# Patient Record
Sex: Male | Born: 1960
Health system: Southern US, Community
[De-identification: ages and names within clinical notes are randomized; demographics above are authoritative.]

## PROBLEM LIST (undated history)

## (undated) DIAGNOSIS — T4145XA Adverse effect of unspecified anesthetic, initial encounter: Secondary | ICD-10-CM

## (undated) DIAGNOSIS — T8859XA Other complications of anesthesia, initial encounter: Secondary | ICD-10-CM

## (undated) DIAGNOSIS — R112 Nausea with vomiting, unspecified: Secondary | ICD-10-CM

## (undated) DIAGNOSIS — Z9889 Other specified postprocedural states: Secondary | ICD-10-CM

## (undated) DIAGNOSIS — M1990 Unspecified osteoarthritis, unspecified site: Secondary | ICD-10-CM

---
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
## 2016-09-06 DIAGNOSIS — Z0001 Encounter for general adult medical examination with abnormal findings: Secondary | ICD-10-CM | POA: Diagnosis not present

## 2016-09-09 ENCOUNTER — Encounter (INDEPENDENT_AMBULATORY_CARE_PROVIDER_SITE_OTHER): Payer: Self-pay | Admitting: *Deleted

## 2017-01-03 DIAGNOSIS — M25561 Pain in right knee: Secondary | ICD-10-CM | POA: Diagnosis not present

## 2017-01-06 DIAGNOSIS — M170 Bilateral primary osteoarthritis of knee: Secondary | ICD-10-CM | POA: Diagnosis not present

## 2017-01-19 DIAGNOSIS — M25561 Pain in right knee: Secondary | ICD-10-CM | POA: Diagnosis not present

## 2017-01-20 DIAGNOSIS — M25561 Pain in right knee: Secondary | ICD-10-CM | POA: Diagnosis not present

## 2017-01-23 DIAGNOSIS — Z79899 Other long term (current) drug therapy: Secondary | ICD-10-CM | POA: Diagnosis not present

## 2017-01-23 DIAGNOSIS — R42 Dizziness and giddiness: Secondary | ICD-10-CM | POA: Diagnosis not present

## 2017-01-23 DIAGNOSIS — R55 Syncope and collapse: Secondary | ICD-10-CM | POA: Diagnosis not present

## 2017-01-23 DIAGNOSIS — R918 Other nonspecific abnormal finding of lung field: Secondary | ICD-10-CM | POA: Diagnosis not present

## 2017-01-27 DIAGNOSIS — M170 Bilateral primary osteoarthritis of knee: Secondary | ICD-10-CM | POA: Diagnosis not present

## 2017-01-27 DIAGNOSIS — M25561 Pain in right knee: Secondary | ICD-10-CM | POA: Diagnosis not present

## 2017-02-24 DIAGNOSIS — M1711 Unilateral primary osteoarthritis, right knee: Secondary | ICD-10-CM | POA: Diagnosis not present

## 2017-03-22 DIAGNOSIS — M1711 Unilateral primary osteoarthritis, right knee: Secondary | ICD-10-CM | POA: Diagnosis not present

## 2017-04-06 DIAGNOSIS — M1711 Unilateral primary osteoarthritis, right knee: Secondary | ICD-10-CM | POA: Diagnosis not present

## 2017-05-05 DIAGNOSIS — M1711 Unilateral primary osteoarthritis, right knee: Secondary | ICD-10-CM | POA: Diagnosis not present

## 2017-05-24 DIAGNOSIS — R05 Cough: Secondary | ICD-10-CM | POA: Diagnosis not present

## 2017-05-24 DIAGNOSIS — J3089 Other allergic rhinitis: Secondary | ICD-10-CM | POA: Diagnosis not present

## 2017-05-24 DIAGNOSIS — M25561 Pain in right knee: Secondary | ICD-10-CM | POA: Diagnosis not present

## 2017-06-20 NOTE — Progress Notes (Signed)
Please place orders in EPIC as patient has a pre-op appointment on 06/27/2017! Thank you!

## 2017-06-21 ENCOUNTER — Ambulatory Visit: Payer: Self-pay | Admitting: Orthopedic Surgery

## 2017-06-24 NOTE — Patient Instructions (Signed)
Jordan Lopez  06/24/2017   Your procedure is scheduled on: 07/04/2017   Report to [facility] Main  Entrance  Go to Admitting at 0800am      Call this number if you have problems the morning of surgery [phone]    Remember: ONLY 1 PERSON MAY GO WITH YOU TO SHORT STAY TO GET  READY MORNING OF YOUR SURGERY.  Do not eat food or drink liquids :After Midnight.     Take these medicines the morning of surgery with A SIP OF WATER:                                 You may not have any metal on your body including hair pins and              piercings  Do not wear jewelry, , lotions, powders or perfumes, deodorant                         Men may shave face and neck.   Do not bring valuables to the hospital. [place].  Contacts, dentures or bridgework may not be worn into surgery.  Leave suitcase in the car. After surgery it may be brought to your room.                     Please read over the following fact sheets you were given: _____________________________________________________________________             [facility] - Preparing for Surgery Before surgery, you can play an important role.  Because skin is not sterile, your skin needs to be as free of germs as possible.  You can reduce the number of germs on your skin by washing with CHG (chlorahexidine gluconate) soap before surgery.  CHG is an antiseptic cleaner which kills germs and bonds with the skin to continue killing germs even after washing. Please DO NOT use if you have an allergy to CHG or antibacterial soaps.  If your skin becomes reddened/irritated stop using the CHG and inform your nurse when you arrive at Short Stay. Do not shave (including legs and underarms) for at least 48 hours prior to the first CHG shower.  You may shave your face/neck. Please follow these instructions carefully:  1.  Shower with CHG Soap the night before surgery and  the  morning of Surgery.  2.  If you choose to wash your hair, wash your hair first as usual with your  normal  shampoo.  3.  After you shampoo, rinse your hair and body thoroughly to remove the  shampoo.                           4.  Use CHG as you would any other liquid soap.  You can apply chg directly  to the skin and wash                       Gently with a scrungie or clean washcloth.  5.  Apply the CHG Soap to your body ONLY FROM THE NECK DOWN.   Do not use on face/ open  Wound or open sores. Avoid contact with eyes, ears mouth and genitals (private parts).                       Wash face,  Genitals (private parts) with your normal soap.             6.  Wash thoroughly, paying special attention to the area where your surgery  will be performed.  7.  Thoroughly rinse your body with warm water from the neck down.  8.  DO NOT shower/wash with your normal soap after using and rinsing off  the CHG Soap.                9.  Pat yourself dry with a clean towel.            10.  Wear clean pajamas.            11.  Place clean sheets on your bed the night of your first shower and do not  sleep with pets. Day of Surgery : Do not apply any lotions/deodorants the morning of surgery.  Please wear clean clothes to the hospital/surgery center.  FAILURE TO FOLLOW THESE INSTRUCTIONS MAY RESULT IN THE CANCELLATION OF YOUR SURGERY PATIENT SIGNATURE_________________________________  NURSE SIGNATURE__________________________________  ________________________________________________________________________  WHAT IS A BLOOD TRANSFUSION? Blood Transfusion Information  A transfusion is the replacement of blood or some of its parts. Blood is made up of multiple cells which provide different functions.  Red blood cells carry oxygen and are used for blood loss replacement.  White blood cells fight against infection.  Platelets control bleeding.  Plasma helps clot blood.  Other  blood products are available for specialized needs, such as hemophilia or other clotting disorders. BEFORE THE TRANSFUSION  Who gives blood for transfusions?   Healthy volunteers who are fully evaluated to make sure their blood is safe. This is blood bank blood. Transfusion therapy is the safest it has ever been in the practice of medicine. Before blood is taken from a donor, a complete history is taken to make sure that person has no history of diseases nor engages in risky social behavior (examples are intravenous drug use or sexual activity with multiple partners). The donor's travel history is screened to minimize risk of transmitting infections, such as malaria. The donated blood is tested for signs of infectious diseases, such as HIV and hepatitis. The blood is then tested to be sure it is compatible with you in order to minimize the chance of a transfusion reaction. If you or a relative donates blood, this is often done in anticipation of surgery and is not appropriate for emergency situations. It takes many days to process the donated blood. RISKS AND COMPLICATIONS Although transfusion therapy is very safe and saves many lives, the main dangers of transfusion include:   Getting an infectious disease.  Developing a transfusion reaction. This is an allergic reaction to something in the blood you were given. Every precaution is taken to prevent this. The decision to have a blood transfusion has been considered carefully by your caregiver before blood is given. Blood is not given unless the benefits outweigh the risks. AFTER THE TRANSFUSION  Right after receiving a blood transfusion, you will usually feel much better and more energetic. This is especially true if your red blood cells have gotten low (anemic). The transfusion raises the level of the red blood cells which carry oxygen, and this usually causes an energy increase.  The  nurse administering the transfusion will monitor you carefully  for complications. HOME CARE INSTRUCTIONS  No special instructions are needed after a transfusion. You may find your energy is better. Speak with your caregiver about any limitations on activity for underlying diseases you may have. SEEK MEDICAL CARE IF:   Your condition is not improving after your transfusion.  You develop redness or irritation at the intravenous (IV) site. SEEK IMMEDIATE MEDICAL CARE IF:  Any of the following symptoms occur over the next 12 hours:  Shaking chills.  You have a temperature by mouth above 102 F (38.9 C), not controlled by medicine.  Chest, back, or muscle pain.  People around you feel you are not acting correctly or are confused.  Shortness of breath or difficulty breathing.  Dizziness and fainting.  You get a rash or develop hives.  You have a decrease in urine output.  Your urine turns a dark color or changes to pink, red, or brown. Any of the following symptoms occur over the next 10 days:  You have a temperature by mouth above 102 F (38.9 C), not controlled by medicine.  Shortness of breath.  Weakness after normal activity.  The white part of the eye turns yellow (jaundice).  You have a decrease in the amount of urine or are urinating less often.  Your urine turns a dark color or changes to pink, red, or brown. Document Released: 08/13/2000 Document Revised: 11/08/2011 Document Reviewed: 04/01/2008 ExitCare Patient Information 2014 [place].  _______________________________________________________________________  Incentive Spirometer  An incentive spirometer is a tool that can help keep your lungs clear and active. This tool measures how well you are filling your lungs with each breath. Taking long deep breaths may help reverse or decrease the chance of developing breathing (pulmonary) problems (especially infection) following:  A long period of time when you are unable to move or be active. BEFORE THE PROCEDURE    If the spirometer includes an indicator to show your best effort, your nurse or respiratory therapist will set it to a desired goal.  If possible, sit up straight or lean slightly forward. Try not to slouch.  Hold the incentive spirometer in an upright position. INSTRUCTIONS FOR USE  1. Sit on the edge of your bed if possible, or sit up as far as you can in bed or on a chair. 2. Hold the incentive spirometer in an upright position. 3. Breathe out normally. 4. Place the mouthpiece in your mouth and seal your lips tightly around it. 5. Breathe in slowly and as deeply as possible, raising the piston or the ball toward the top of the column. 6. Hold your breath for 3-5 seconds or for as long as possible. Allow the piston or ball to fall to the bottom of the column. 7. Remove the mouthpiece from your mouth and breathe out normally. 8. Rest for a few seconds and repeat Steps 1 through 7 at least 10 times every 1-2 hours when you are awake. Take your time and take a few normal breaths between deep breaths. 9. The spirometer may include an indicator to show your best effort. Use the indicator as a goal to work toward during each repetition. 10. After each set of 10 deep breaths, practice coughing to be sure your lungs are clear. If you have an incision (the cut made at the time of surgery), support your incision when coughing by placing a pillow or rolled up towels firmly against it. Once you are able to get  out of bed, walk around indoors and cough well. You may stop using the incentive spirometer when instructed by your caregiver.  RISKS AND COMPLICATIONS  Take your time so you do not get dizzy or light-headed.  If you are in pain, you may need to take or ask for pain medication before doing incentive spirometry. It is harder to take a deep breath if you are having pain. AFTER USE  Rest and breathe slowly and easily.  It can be helpful to keep track of a log of your progress. Your caregiver  can provide you with a simple table to help with this. If you are using the spirometer at home, follow these instructions: [place] IF:   You are having difficultly using the spirometer.  You have trouble using the spirometer as often as instructed.  Your pain medication is not giving enough relief while using the spirometer.  You develop fever of 100.5 F (38.1 C) or higher. SEEK IMMEDIATE MEDICAL CARE IF:   You cough up bloody sputum that had not been present before.  You develop fever of 102 F (38.9 C) or greater.  You develop worsening pain at or near the incision site. MAKE SURE YOU:   Understand these instructions.  Will watch your condition.  Will get help right away if you are not doing well or get worse. Document Released: 12/27/2006 Document Revised: 11/08/2011 Document Reviewed: 02/27/2007 [facility] Patient Information 2014 [place], [place].   ________________________________________________________________________

## 2017-06-26 ENCOUNTER — Ambulatory Visit: Payer: Self-pay | Admitting: Orthopedic Surgery

## 2017-06-27 ENCOUNTER — Encounter (HOSPITAL_COMMUNITY)
Admission: RE | Admit: 2017-06-27 | Discharge: 2017-06-27 | Disposition: A | Discharge status: Home / Self Care | Payer: 59 | Source: Ambulatory Visit | Attending: Orthopedic Surgery | Admitting: Orthopedic Surgery

## 2017-06-27 ENCOUNTER — Encounter (INDEPENDENT_AMBULATORY_CARE_PROVIDER_SITE_OTHER): Payer: Self-pay

## 2017-06-27 ENCOUNTER — Encounter (HOSPITAL_COMMUNITY): Payer: Self-pay

## 2017-06-27 DIAGNOSIS — Z0183 Encounter for blood typing: Secondary | ICD-10-CM | POA: Diagnosis not present

## 2017-06-27 DIAGNOSIS — M1711 Unilateral primary osteoarthritis, right knee: Secondary | ICD-10-CM | POA: Insufficient documentation

## 2017-06-27 DIAGNOSIS — Z01812 Encounter for preprocedural laboratory examination: Secondary | ICD-10-CM | POA: Insufficient documentation

## 2017-06-27 HISTORY — DX: Unspecified osteoarthritis, unspecified site: M19.90

## 2017-06-27 HISTORY — DX: Other specified postprocedural states: Z98.890

## 2017-06-27 HISTORY — DX: Adverse effect of unspecified anesthetic, initial encounter: T41.45XA

## 2017-06-27 HISTORY — DX: Nausea with vomiting, unspecified: R11.2

## 2017-06-27 HISTORY — DX: Other complications of anesthesia, initial encounter: T88.59XA

## 2017-06-27 LAB — COMPREHENSIVE METABOLIC PANEL
ALT: 19 U/L (ref 17–63)
AST: 21 U/L (ref 15–41)
Albumin: 3.9 g/dL (ref 3.5–5.0)
Alkaline Phosphatase: 71 U/L (ref 38–126)
Anion gap: 11 (ref 5–15)
BUN: 15 mg/dL (ref 6–20)
CO2: 26 mmol/L (ref 22–32)
Calcium: 9.4 mg/dL (ref 8.9–10.3)
Chloride: 102 mmol/L (ref 101–111)
Creatinine, Ser: 1.22 mg/dL (ref 0.61–1.24)
GFR calc Af Amer: >60 mL/min (ref >60)
GFR calc non Af Amer: >60 mL/min (ref >60)
Glucose, Bld: 114 mg/dL — ABNORMAL HIGH (ref 65–99)
Potassium: 4.5 mmol/L (ref 3.5–5.1)
Sodium: 139 mmol/L (ref 135–145)
Total Bilirubin: 0.6 mg/dL (ref 0.3–1.2)
Total Protein: 7.4 g/dL (ref 6.5–8.1)

## 2017-06-27 LAB — CBC
HCT: 46 % (ref 39.0–52.0)
Hemoglobin: 16.1 g/dL (ref 13.0–17.0)
MCH: 30.3 pg (ref 26.0–34.0)
MCHC: 35 g/dL (ref 30.0–36.0)
MCV: 86.6 fL (ref 78.0–100.0)
Platelets: 152 10*3/uL (ref 150–400)
RBC: 5.31 MIL/uL (ref 4.22–5.81)
RDW: 13.1 % (ref 11.5–15.5)
WBC: 10.8 10*3/uL — ABNORMAL HIGH (ref 4.0–10.5)

## 2017-06-27 LAB — PROTIME-INR
INR: 1.03
Prothrombin Time: 13.4 seconds (ref 11.4–15.2)

## 2017-06-27 LAB — APTT: aPTT: 29 seconds (ref 24–36)

## 2017-06-27 LAB — ABO/RH: ABO/RH(D): O POS

## 2017-06-27 NOTE — Progress Notes (Signed)
05/24/17-Clearance- Dr Quintin Alto on chart  CXR-08/04/16 on chart

## 2017-06-28 ENCOUNTER — Ambulatory Visit: Payer: Self-pay | Admitting: Orthopedic Surgery

## 2017-06-28 LAB — SURGICAL PCR SCREEN
MRSA, PCR: POSITIVE — AB
Staphylococcus aureus: POSITIVE — AB

## 2017-06-28 MED ORDER — VANCOMYCIN HCL 10 G IV SOLR: 1000.0000 mg | Freq: Once | INTRAVENOUS | Status: AC

## 2017-06-28 NOTE — Progress Notes (Signed)
Vancomycin IV was ordered to be given at time of surgery on July 04, 2017 due to positive Staph swab. Arlee Muslim, PA-C

## 2017-07-01 NOTE — Progress Notes (Signed)
MRSA+  PCR, Surgery 07/04/17 OK to place order for Vancomycin per Dr Maureen Ralphs.

## 2017-07-03 ENCOUNTER — Ambulatory Visit: Payer: Self-pay | Admitting: Orthopedic Surgery

## 2017-07-03 NOTE — H&P (Signed)
Jordan Lopez DOB: [birth year] Married / Language: Cleophus Molt / Race: White Male Date of admission: July 04, 2017   Chief complaint: Right knee pain History of Present Illness  The patient is a 56 year old male who comes in for a preoperative History and Physical. The patient is scheduled for a right total knee arthroplasty to be performed by Dr. Dione Plover. Aluisio, MD at [facility] on 07/04/2017. The patient is a 56 year old male who presented for follow up of their knee. The patient is being followed for their right knee pain and osteoarthritis. They are now months out from [place] injection. Symptoms reported include: pain, swelling, aching, stiffness and difficulty ambulating. The patient feels that they are doing poorly. The following medication has been used for pain control: antiinflammatory medication (Aleve, prn). The patient has not gotten any relief of their symptoms with viscosupplementation. He is not in as much pain as he was a few months ago but that is because he has not been working since May. He is at a point where he would like to discuss knee replacement. He does work as an Clinical biochemist. He is having problems with that knee at all times. He has been unable to go back to work due to the knee pain and dysfunction. Cortisone and Visco supplements have not been of benefit. The knee is essentially taken over his life. he has developed advanced end-stage arthritis of the RIGHT knee with progressive pain and dysfunction. Patient has significant pain and dysfunction in their knee. They have had injections and failed nonoperative management. At this point the pain and dysfunction have essentially taken over their life. The most predictable means of improving pain and function is total knee arthroplasty. They have been treated conservatively in the past for the above stated problem and despite conservative measures, they continue to have progressive pain and severe functional  limitations and dysfunction. They have failed non-operative management including home exercise, medications, and injections. It is felt that they would benefit from undergoing total joint replacement. Risks and benefits of the procedure have been discussed with the patient and they elect to proceed with surgery. There are no active contraindications to surgery such as ongoing infection or rapidly progressive neurological disease.   Problem List/Past Medical Internal derangement of knee (M23.90)  left Acute pain of left knee (M25.562)  Primary osteoarthritis of one knee (M17.10)  left Measles  Mumps  Enlarged prostate  Allergies  Hydrocodone w/APAP *ANALGESICS - OPIOID*  Nausea.  Family History Heart Disease  First Degree Relatives. father Diabetes Mellitus  First Degree Relatives. father Cerebrovascular Accident  grandmother fathers side Congestive Heart Failure  father and grandfather fathers side Heart disease in male family member before age 28  Hypertension  father Osteoarthritis  mother Osteoporosis  mother  Social History Tobacco use  Never smoker. never smoker Alcohol use  current drinker; drinks beer; 5-7 per week Pain Contract  no Children  1 Current work status  working full time Drug/Alcohol Rehab (Currently)  no Drug/Alcohol Rehab (Previously)  no Exercise  Exercises rarely; does other Illicit drug use  no Living situation  live with spouse Marital status  married Number of flights of stairs before winded  greater than 5 Tobacco / smoke exposure  no Post-Surgical Plans  Home With Family, Home, Straight to Outpatient Therapy. Advance Directives  None.  Medication History Aleve (220MG  Tablet, Oral) Active. Proscar (5MG  Tablet, Oral) Active. Flomax (0.4MG  Capsule, Oral) Active.  Past Surgical History  Foot  Surgery  right    Review of Systems  General Present- Weight Loss. Not Present- Chills, Fatigue, Fever, Memory Loss,  Night Sweats and Weight Gain. Skin Not Present- Eczema, Hives, Itching, Lesions and Rash. HEENT Not Present- Dentures, Double Vision, Headache, Hearing Loss, Tinnitus and Visual Loss. Respiratory Present- Chronic Cough and Snoring. Not Present- Allergies, Coughing up blood, Shortness of breath at rest and Shortness of breath with exertion. Cardiovascular Not Present- Chest Pain, Difficulty Breathing Lying Down, Murmur, Palpitations, Racing/skipping heartbeats and Swelling. Gastrointestinal Not Present- Abdominal Pain, Bloody Stool, Constipation, Diarrhea, Difficulty Swallowing, Heartburn, Jaundice, Loss of appetitie, Nausea and Vomiting. Male Genitourinary Present- Urinary frequency and Weak urinary stream. Not Present- Blood in Urine, Discharge, Flank Pain, Incontinence, Painful Urination, Urgency, Urinary Retention and Urinating at Night. Musculoskeletal Present- Joint Pain and Joint Swelling. Not Present- Back Pain, Morning Stiffness, Muscle Pain, Muscle Weakness and Spasms. Neurological Present- Blackout spells and Fainting (Childhood occurrence, Reoccurrence once this year). Not Present- Difficulty with balance, Dizziness, Paralysis, Tremor and Weakness. Psychiatric Not Present- Insomnia.  Vitals  Weight: 238 lb Height: 68in Weight was reported by patient. Height was reported by patient. Body Surface Area: 2.2 m Body Mass Index: 36.19 kg/m  Pulse: 60 (Regular)  BP: 112/62 (Sitting, Right Arm, Standard)    Physical Exam  General Mental Status -Alert, cooperative and good historian. General Appearance-pleasant, Not in acute distress. Orientation-Oriented X3. Build & Nutrition-Well nourished and Well developed.  Head and Neck Head-normocephalic, atraumatic . Neck Global Assessment - supple, no bruit auscultated on the right, no bruit auscultated on the left.  Eye Pupil - Bilateral-Regular and Round. Motion - Bilateral-EOMI.  Chest and Lung  Exam Auscultation Breath sounds - clear at anterior chest wall and clear at posterior chest wall. Adventitious sounds - No Adventitious sounds.  Cardiovascular Auscultation Rhythm - Regular rate and rhythm. Heart Sounds - S1 WNL and S2 WNL. Murmurs & Other Heart Sounds - Auscultation of the heart reveals - No Murmurs.  Abdomen Inspection Contour - Generalized mild distention. Palpation/Percussion Tenderness - Abdomen is non-tender to palpation. Rigidity (guarding) - Abdomen is soft. Auscultation Auscultation of the abdomen reveals - Bowel sounds normal.  Male Genitourinary Note: Not done, not pertinent to present illness   Musculoskeletal Note: His RIGHT knee shows no effusion. He has a varus deformity. His range of motion is 5-125. He is tender medial greater than lateral with no instability noted. Pulses sensation and motor are intact. He has an antalgic gait pattern on the RIGHT.  We again reviewed his radiographs showing that he has bone-on-bone arthritis in the medial and patellofemoral compartments of the RIGHT knee.   Assessment & Plan  Primary osteoarthritis of right knee (M17.11)   Note:Surgical Plans: Right Total Knee Replacement  Disposition: Home with help, Outpatient Therapy at [facility] in [place], [place]  PCP: Dr. Quintin Alto - Patient has been seen preoperatively and felt to be stable for surgery.  IV TXA  Anesthesia Issues: Nausea  Patient was instructed on what medications to stop prior to surgery.  Signed electronically by Joelene Millin, III PA-C

## 2017-07-03 NOTE — H&P (View-Only) (Signed)
Jordan Lopez DOB: [birth year] Married / Language: Cleophus Molt / Race: White Male Date of admission: July 04, 2017   Chief complaint: Right knee pain History of Present Illness  The patient is a 56 year old male who comes in for a preoperative History and Physical. The patient is scheduled for a right total knee arthroplasty to be performed by Dr. Dione Plover. Aluisio, MD at [facility] on 07/04/2017. The patient is a 56 year old male who presented for follow up of their knee. The patient is being followed for their right knee pain and osteoarthritis. They are now months out from [place] injection. Symptoms reported include: pain, swelling, aching, stiffness and difficulty ambulating. The patient feels that they are doing poorly. The following medication has been used for pain control: antiinflammatory medication (Aleve, prn). The patient has not gotten any relief of their symptoms with viscosupplementation. He is not in as much pain as he was a few months ago but that is because he has not been working since May. He is at a point where he would like to discuss knee replacement. He does work as an Clinical biochemist. He is having problems with that knee at all times. He has been unable to go back to work due to the knee pain and dysfunction. Cortisone and Visco supplements have not been of benefit. The knee is essentially taken over his life. he has developed advanced end-stage arthritis of the RIGHT knee with progressive pain and dysfunction. Patient has significant pain and dysfunction in their knee. They have had injections and failed nonoperative management. At this point the pain and dysfunction have essentially taken over their life. The most predictable means of improving pain and function is total knee arthroplasty. They have been treated conservatively in the past for the above stated problem and despite conservative measures, they continue to have progressive pain and severe functional  limitations and dysfunction. They have failed non-operative management including home exercise, medications, and injections. It is felt that they would benefit from undergoing total joint replacement. Risks and benefits of the procedure have been discussed with the patient and they elect to proceed with surgery. There are no active contraindications to surgery such as ongoing infection or rapidly progressive neurological disease.   Problem List/Past Medical Internal derangement of knee (M23.90)  left Acute pain of left knee (M25.562)  Primary osteoarthritis of one knee (M17.10)  left Measles  Mumps  Enlarged prostate  Allergies  Hydrocodone w/APAP *ANALGESICS - OPIOID*  Nausea.  Family History Heart Disease  First Degree Relatives. father Diabetes Mellitus  First Degree Relatives. father Cerebrovascular Accident  grandmother fathers side Congestive Heart Failure  father and grandfather fathers side Heart disease in male family member before age 27  Hypertension  father Osteoarthritis  mother Osteoporosis  mother  Social History Tobacco use  Never smoker. never smoker Alcohol use  current drinker; drinks beer; 5-7 per week Pain Contract  no Children  1 Current work status  working full time Drug/Alcohol Rehab (Currently)  no Drug/Alcohol Rehab (Previously)  no Exercise  Exercises rarely; does other Illicit drug use  no Living situation  live with spouse Marital status  married Number of flights of stairs before winded  greater than 5 Tobacco / smoke exposure  no Post-Surgical Plans  Home With Family, Home, Straight to Outpatient Therapy. Advance Directives  None.  Medication History Aleve (220MG  Tablet, Oral) Active. Proscar (5MG  Tablet, Oral) Active. Flomax (0.4MG  Capsule, Oral) Active.  Past Surgical History  Foot  Surgery  right    Review of Systems  General Present- Weight Loss. Not Present- Chills, Fatigue, Fever, Memory Loss,  Night Sweats and Weight Gain. Skin Not Present- Eczema, Hives, Itching, Lesions and Rash. HEENT Not Present- Dentures, Double Vision, Headache, Hearing Loss, Tinnitus and Visual Loss. Respiratory Present- Chronic Cough and Snoring. Not Present- Allergies, Coughing up blood, Shortness of breath at rest and Shortness of breath with exertion. Cardiovascular Not Present- Chest Pain, Difficulty Breathing Lying Down, Murmur, Palpitations, Racing/skipping heartbeats and Swelling. Gastrointestinal Not Present- Abdominal Pain, Bloody Stool, Constipation, Diarrhea, Difficulty Swallowing, Heartburn, Jaundice, Loss of appetitie, Nausea and Vomiting. Male Genitourinary Present- Urinary frequency and Weak urinary stream. Not Present- Blood in Urine, Discharge, Flank Pain, Incontinence, Painful Urination, Urgency, Urinary Retention and Urinating at Night. Musculoskeletal Present- Joint Pain and Joint Swelling. Not Present- Back Pain, Morning Stiffness, Muscle Pain, Muscle Weakness and Spasms. Neurological Present- Blackout spells and Fainting (Childhood occurrence, Reoccurrence once this year). Not Present- Difficulty with balance, Dizziness, Paralysis, Tremor and Weakness. Psychiatric Not Present- Insomnia.  Vitals  Weight: 238 lb Height: 68in Weight was reported by patient. Height was reported by patient. Body Surface Area: 2.2 m Body Mass Index: 36.19 kg/m  Pulse: 60 (Regular)  BP: 112/62 (Sitting, Right Arm, Standard)    Physical Exam  General Mental Status -Alert, cooperative and good historian. General Appearance-pleasant, Not in acute distress. Orientation-Oriented X3. Build & Nutrition-Well nourished and Well developed.  Head and Neck Head-normocephalic, atraumatic . Neck Global Assessment - supple, no bruit auscultated on the right, no bruit auscultated on the left.  Eye Pupil - Bilateral-Regular and Round. Motion - Bilateral-EOMI.  Chest and Lung  Exam Auscultation Breath sounds - clear at anterior chest wall and clear at posterior chest wall. Adventitious sounds - No Adventitious sounds.  Cardiovascular Auscultation Rhythm - Regular rate and rhythm. Heart Sounds - S1 WNL and S2 WNL. Murmurs & Other Heart Sounds - Auscultation of the heart reveals - No Murmurs.  Abdomen Inspection Contour - Generalized mild distention. Palpation/Percussion Tenderness - Abdomen is non-tender to palpation. Rigidity (guarding) - Abdomen is soft. Auscultation Auscultation of the abdomen reveals - Bowel sounds normal.  Male Genitourinary Note: Not done, not pertinent to present illness   Musculoskeletal Note: His RIGHT knee shows no effusion. He has a varus deformity. His range of motion is 5-125. He is tender medial greater than lateral with no instability noted. Pulses sensation and motor are intact. He has an antalgic gait pattern on the RIGHT.  We again reviewed his radiographs showing that he has bone-on-bone arthritis in the medial and patellofemoral compartments of the RIGHT knee.   Assessment & Plan  Primary osteoarthritis of right knee (M17.11)   Note:Surgical Plans: Right Total Knee Replacement  Disposition: Home with help, Outpatient Therapy at [facility] in [place], [place]  PCP: Dr. Quintin Alto - Patient has been seen preoperatively and felt to be stable for surgery.  IV TXA  Anesthesia Issues: Nausea  Patient was instructed on what medications to stop prior to surgery.  Signed electronically by Joelene Millin, III PA-C

## 2017-07-04 ENCOUNTER — Inpatient Hospital Stay (HOSPITAL_COMMUNITY)
Admission: RE | Admit: 2017-07-04 | Discharge: 2017-07-05 | DRG: 470 | Disposition: A | Discharge status: Home / Self Care | Payer: 59 | Source: Ambulatory Visit | Attending: Orthopedic Surgery | Admitting: Orthopedic Surgery

## 2017-07-04 ENCOUNTER — Encounter (HOSPITAL_COMMUNITY)
Admission: RE | Disposition: A | Discharge status: Home / Self Care | Payer: Self-pay | Source: Ambulatory Visit | Attending: Orthopedic Surgery

## 2017-07-04 ENCOUNTER — Other Ambulatory Visit: Payer: Self-pay

## 2017-07-04 ENCOUNTER — Inpatient Hospital Stay (HOSPITAL_COMMUNITY): Payer: 59 | Admitting: Registered Nurse

## 2017-07-04 ENCOUNTER — Encounter (HOSPITAL_COMMUNITY): Payer: Self-pay | Admitting: Registered Nurse

## 2017-07-04 DIAGNOSIS — E669 Obesity, unspecified: Secondary | ICD-10-CM | POA: Diagnosis present

## 2017-07-04 DIAGNOSIS — R269 Unspecified abnormalities of gait and mobility: Secondary | ICD-10-CM | POA: Diagnosis not present

## 2017-07-04 DIAGNOSIS — M1711 Unilateral primary osteoarthritis, right knee: Secondary | ICD-10-CM | POA: Diagnosis not present

## 2017-07-04 DIAGNOSIS — Z6836 Body mass index (BMI) 36.0-36.9, adult: Secondary | ICD-10-CM

## 2017-07-04 DIAGNOSIS — Z22322 Carrier or suspected carrier of Methicillin resistant Staphylococcus aureus: Secondary | ICD-10-CM | POA: Diagnosis not present

## 2017-07-04 DIAGNOSIS — M179 Osteoarthritis of knee, unspecified: Secondary | ICD-10-CM | POA: Diagnosis present

## 2017-07-04 DIAGNOSIS — N400 Enlarged prostate without lower urinary tract symptoms: Secondary | ICD-10-CM | POA: Diagnosis present

## 2017-07-04 DIAGNOSIS — M25561 Pain in right knee: Secondary | ICD-10-CM | POA: Diagnosis not present

## 2017-07-04 DIAGNOSIS — G8918 Other acute postprocedural pain: Secondary | ICD-10-CM | POA: Diagnosis not present

## 2017-07-04 DIAGNOSIS — M1710 Unilateral primary osteoarthritis, unspecified knee: Secondary | ICD-10-CM | POA: Diagnosis present

## 2017-07-04 HISTORY — PX: TOTAL KNEE ARTHROPLASTY: SHX125

## 2017-07-04 LAB — COMPREHENSIVE METABOLIC PANEL
ALT: 19 U/L (ref 17–63)
AST: 18 U/L (ref 15–41)
Albumin: 4 g/dL (ref 3.5–5.0)
Alkaline Phosphatase: 64 U/L (ref 38–126)
Anion gap: 7 (ref 5–15)
BUN: 17 mg/dL (ref 6–20)
CO2: 28 mmol/L (ref 22–32)
Calcium: 9 mg/dL (ref 8.9–10.3)
Chloride: 103 mmol/L (ref 101–111)
Creatinine, Ser: 1.01 mg/dL (ref 0.61–1.24)
GFR calc Af Amer: >60 mL/min (ref >60)
GFR calc non Af Amer: >60 mL/min (ref >60)
Glucose, Bld: 144 mg/dL — ABNORMAL HIGH (ref 65–99)
Potassium: 4.9 mmol/L (ref 3.5–5.1)
Sodium: 138 mmol/L (ref 135–145)
Total Bilirubin: 0.9 mg/dL (ref 0.3–1.2)
Total Protein: 7 g/dL (ref 6.5–8.1)

## 2017-07-04 LAB — TYPE AND SCREEN
ABO/RH(D): O POS
Antibody Screen: NEGATIVE

## 2017-07-04 LAB — PROTIME-INR
INR: 1.01
Prothrombin Time: 13.3 seconds (ref 11.4–15.2)

## 2017-07-04 LAB — CBC
HCT: 42.1 % (ref 39.0–52.0)
Hemoglobin: 14.2 g/dL (ref 13.0–17.0)
MCH: 29.7 pg (ref 26.0–34.0)
MCHC: 33.7 g/dL (ref 30.0–36.0)
MCV: 88.1 fL (ref 78.0–100.0)
Platelets: 146 10*3/uL — ABNORMAL LOW (ref 150–400)
RBC: 4.78 MIL/uL (ref 4.22–5.81)
RDW: 12.8 % (ref 11.5–15.5)
WBC: 8.9 10*3/uL (ref 4.0–10.5)

## 2017-07-04 LAB — APTT: aPTT: 30 seconds (ref 24–36)

## 2017-07-04 SURGERY — ARTHROPLASTY, KNEE, TOTAL
Anesthesia: Spinal | Site: Knee | Laterality: Right

## 2017-07-04 MED ORDER — 0.9 % SODIUM CHLORIDE (POUR BTL) OPTIME
TOPICAL | Status: DC | PRN
  Administered 2017-07-04: 1000 mL

## 2017-07-04 MED ORDER — CEFAZOLIN SODIUM-DEXTROSE 2-4 GM/100ML-% IV SOLN
2.0000 g | INTRAVENOUS | Status: AC
  Administered 2017-07-04: 2 g via INTRAVENOUS

## 2017-07-04 MED ORDER — ALBUMIN HUMAN 5 % IV SOLN
12.5000 g | Freq: Once | INTRAVENOUS | Status: AC
  Administered 2017-07-04: 12.5 g via INTRAVENOUS

## 2017-07-04 MED ORDER — GABAPENTIN 300 MG PO CAPS
300.0000 mg | ORAL_CAPSULE | Freq: Once | ORAL | Status: AC
  Administered 2017-07-04: 300 mg via ORAL

## 2017-07-04 MED ORDER — MIDAZOLAM HCL 2 MG/2ML IJ SOLN
INTRAMUSCULAR | Status: AC
  Filled 2017-07-04: qty 2

## 2017-07-04 MED ORDER — METOCLOPRAMIDE HCL 5 MG PO TABS: 5.0000 mg | ORAL_TABLET | Freq: Three times a day (TID) | ORAL | Status: DC | PRN

## 2017-07-04 MED ORDER — BUPIVACAINE IN DEXTROSE 0.75-8.25 % IT SOLN
INTRATHECAL | Status: DC | PRN
  Administered 2017-07-04: 2 mL via INTRATHECAL

## 2017-07-04 MED ORDER — BUPIVACAINE LIPOSOME 1.3 % IJ SUSP
20.0000 mL | Freq: Once | INTRAMUSCULAR | Status: DC
  Filled 2017-07-04: qty 20

## 2017-07-04 MED ORDER — POLYETHYLENE GLYCOL 3350 17 G PO PACK: 17.0000 g | PACK | Freq: Every day | ORAL | Status: DC | PRN

## 2017-07-04 MED ORDER — PHENOL 1.4 % MT LIQD: 1.0000 | OROMUCOSAL | Status: DC | PRN

## 2017-07-04 MED ORDER — CEFAZOLIN SODIUM-DEXTROSE 2-4 GM/100ML-% IV SOLN
INTRAVENOUS | Status: AC
  Filled 2017-07-04: qty 100

## 2017-07-04 MED ORDER — PROPOFOL 10 MG/ML IV BOLUS
INTRAVENOUS | Status: AC
  Filled 2017-07-04: qty 40

## 2017-07-04 MED ORDER — CEFAZOLIN SODIUM-DEXTROSE 2-4 GM/100ML-% IV SOLN
2.0000 g | Freq: Four times a day (QID) | INTRAVENOUS | Status: AC
  Administered 2017-07-04 (×2): 2 g via INTRAVENOUS
  Filled 2017-07-04 (×2): qty 100

## 2017-07-04 MED ORDER — ONDANSETRON HCL 4 MG PO TABS: 4.0000 mg | ORAL_TABLET | Freq: Four times a day (QID) | ORAL | Status: DC | PRN

## 2017-07-04 MED ORDER — OXYCODONE HCL 5 MG PO TABS: 10.0000 mg | ORAL_TABLET | ORAL | Status: DC | PRN

## 2017-07-04 MED ORDER — PROMETHAZINE HCL 25 MG/ML IJ SOLN: 6.2500 mg | INTRAMUSCULAR | Status: DC | PRN

## 2017-07-04 MED ORDER — SCOPOLAMINE 1 MG/3DAYS TD PT72
MEDICATED_PATCH | TRANSDERMAL | Status: AC
  Filled 2017-07-04: qty 1

## 2017-07-04 MED ORDER — ONDANSETRON HCL 4 MG/2ML IJ SOLN: 4.0000 mg | Freq: Four times a day (QID) | INTRAMUSCULAR | Status: DC | PRN

## 2017-07-04 MED ORDER — FINASTERIDE 5 MG PO TABS
5.0000 mg | ORAL_TABLET | Freq: Every day | ORAL | Status: DC
  Administered 2017-07-05: 5 mg via ORAL
  Filled 2017-07-04: qty 1

## 2017-07-04 MED ORDER — DEXAMETHASONE SODIUM PHOSPHATE 10 MG/ML IJ SOLN: 10.0000 mg | Freq: Once | INTRAMUSCULAR | Status: DC

## 2017-07-04 MED ORDER — FLEET ENEMA 7-19 GM/118ML RE ENEM: 1.0000 | ENEMA | Freq: Once | RECTAL | Status: DC | PRN

## 2017-07-04 MED ORDER — DOCUSATE SODIUM 100 MG PO CAPS
100.0000 mg | ORAL_CAPSULE | Freq: Two times a day (BID) | ORAL | Status: DC
  Administered 2017-07-04 – 2017-07-05 (×2): 100 mg via ORAL
  Filled 2017-07-04 (×2): qty 1

## 2017-07-04 MED ORDER — TRANEXAMIC ACID 1000 MG/10ML IV SOLN
1000.0000 mg | INTRAVENOUS | Status: AC
  Administered 2017-07-04: 1000 mg via INTRAVENOUS
  Filled 2017-07-04: qty 1100

## 2017-07-04 MED ORDER — SCOPOLAMINE 1 MG/3DAYS TD PT72
MEDICATED_PATCH | TRANSDERMAL | Status: DC | PRN
  Administered 2017-07-04: 1 via TRANSDERMAL

## 2017-07-04 MED ORDER — GABAPENTIN 300 MG PO CAPS
ORAL_CAPSULE | ORAL | Status: AC
  Filled 2017-07-04: qty 1

## 2017-07-04 MED ORDER — MIDAZOLAM HCL 5 MG/5ML IJ SOLN
INTRAMUSCULAR | Status: DC | PRN
Start: 2017-07-04 — End: 2017-07-04
  Administered 2017-07-04 (×2): 1 mg via INTRAVENOUS

## 2017-07-04 MED ORDER — DM-GUAIFENESIN ER 30-600 MG PO TB12
1.0000 | ORAL_TABLET | Freq: Every day | ORAL | Status: DC
  Administered 2017-07-05: 1 via ORAL
  Filled 2017-07-04: qty 1

## 2017-07-04 MED ORDER — BISACODYL 10 MG RE SUPP: 10.0000 mg | Freq: Every day | RECTAL | Status: DC | PRN

## 2017-07-04 MED ORDER — CHLORHEXIDINE GLUCONATE CLOTH 2 % EX PADS: 6.0000 | MEDICATED_PAD | Freq: Every day | CUTANEOUS | Status: DC

## 2017-07-04 MED ORDER — FENTANYL CITRATE (PF) 100 MCG/2ML IJ SOLN
INTRAMUSCULAR | Status: AC
  Filled 2017-07-04: qty 2

## 2017-07-04 MED ORDER — TRAMADOL HCL 50 MG PO TABS
50.0000 mg | ORAL_TABLET | Freq: Four times a day (QID) | ORAL | Status: DC | PRN
  Administered 2017-07-05: 16:00:00 100 mg via ORAL
  Filled 2017-07-04: qty 2

## 2017-07-04 MED ORDER — BUPIVACAINE LIPOSOME 1.3 % IJ SUSP
INTRAMUSCULAR | Status: DC | PRN
  Administered 2017-07-04: 20 mL

## 2017-07-04 MED ORDER — LORATADINE 10 MG PO TABS
10.0000 mg | ORAL_TABLET | Freq: Every day | ORAL | Status: DC
  Administered 2017-07-05: 10 mg via ORAL
  Filled 2017-07-04: qty 1

## 2017-07-04 MED ORDER — VANCOMYCIN HCL 10 G IV SOLR
1500.0000 mg | Freq: Once | INTRAVENOUS | Status: AC
  Administered 2017-07-04: 1500 mg via INTRAVENOUS
  Filled 2017-07-04: qty 1500

## 2017-07-04 MED ORDER — PHENYLEPHRINE 40 MCG/ML (10ML) SYRINGE FOR IV PUSH (FOR BLOOD PRESSURE SUPPORT)
PREFILLED_SYRINGE | INTRAVENOUS | Status: AC
  Filled 2017-07-04: qty 30

## 2017-07-04 MED ORDER — LIDOCAINE 2% (20 MG/ML) 5 ML SYRINGE
INTRAMUSCULAR | Status: AC
  Filled 2017-07-04: qty 5

## 2017-07-04 MED ORDER — SODIUM CHLORIDE 0.9 % IJ SOLN
INTRAMUSCULAR | Status: AC
  Filled 2017-07-04: qty 50

## 2017-07-04 MED ORDER — DEXAMETHASONE SODIUM PHOSPHATE 10 MG/ML IJ SOLN
10.0000 mg | Freq: Once | INTRAMUSCULAR | Status: AC
  Administered 2017-07-04: 10 mg via INTRAVENOUS

## 2017-07-04 MED ORDER — MUPIROCIN 2 % EX OINT
1.0000 "application " | TOPICAL_OINTMENT | Freq: Two times a day (BID) | CUTANEOUS | Status: DC
  Administered 2017-07-04 – 2017-07-05 (×3): 1 via NASAL
  Filled 2017-07-04: qty 22

## 2017-07-04 MED ORDER — PROPOFOL 500 MG/50ML IV EMUL
INTRAVENOUS | Status: DC | PRN
  Administered 2017-07-04: 50 ug/kg/min via INTRAVENOUS

## 2017-07-04 MED ORDER — PHENYLEPHRINE 40 MCG/ML (10ML) SYRINGE FOR IV PUSH (FOR BLOOD PRESSURE SUPPORT)
PREFILLED_SYRINGE | INTRAVENOUS | Status: DC | PRN
  Administered 2017-07-04: 80 ug via INTRAVENOUS
  Administered 2017-07-04: 40 ug via INTRAVENOUS
  Administered 2017-07-04: 80 ug via INTRAVENOUS
  Administered 2017-07-04: 120 ug via INTRAVENOUS

## 2017-07-04 MED ORDER — TAMSULOSIN HCL 0.4 MG PO CAPS
0.4000 mg | ORAL_CAPSULE | Freq: Every day | ORAL | Status: DC
  Administered 2017-07-05: 0.4 mg via ORAL
  Filled 2017-07-04: qty 1

## 2017-07-04 MED ORDER — OXYCODONE HCL 5 MG PO TABS
5.0000 mg | ORAL_TABLET | ORAL | Status: DC | PRN
  Administered 2017-07-05 (×3): 5 mg via ORAL
  Filled 2017-07-04 (×4): qty 1

## 2017-07-04 MED ORDER — PROPOFOL 10 MG/ML IV BOLUS
INTRAVENOUS | Status: AC
  Filled 2017-07-04: qty 20

## 2017-07-04 MED ORDER — MENTHOL 3 MG MT LOZG
1.0000 | LOZENGE | OROMUCOSAL | Status: DC | PRN
Start: 2017-07-04 — End: 2017-07-05

## 2017-07-04 MED ORDER — MIDAZOLAM HCL 2 MG/2ML IJ SOLN
1.0000 mg | INTRAMUSCULAR | Status: DC | PRN
Start: 2017-07-04 — End: 2017-07-04
  Administered 2017-07-04: 2 mg via INTRAVENOUS

## 2017-07-04 MED ORDER — METHOCARBAMOL 500 MG PO TABS
500.0000 mg | ORAL_TABLET | Freq: Four times a day (QID) | ORAL | Status: DC | PRN
  Administered 2017-07-05 (×2): 500 mg via ORAL
  Filled 2017-07-04 (×2): qty 1

## 2017-07-04 MED ORDER — TRANEXAMIC ACID 1000 MG/10ML IV SOLN
1000.0000 mg | Freq: Once | INTRAVENOUS | Status: AC
  Administered 2017-07-04: 15:00:00 1000 mg via INTRAVENOUS
  Filled 2017-07-04: qty 1100

## 2017-07-04 MED ORDER — ACETAMINOPHEN 325 MG PO TABS: 650.0000 mg | ORAL_TABLET | ORAL | Status: DC | PRN

## 2017-07-04 MED ORDER — SODIUM CHLORIDE 0.9 % IR SOLN
Status: DC | PRN
  Administered 2017-07-04: 1000 mL

## 2017-07-04 MED ORDER — LACTATED RINGERS IV SOLN
INTRAVENOUS | Status: DC
  Administered 2017-07-04: 1000 mL via INTRAVENOUS
  Administered 2017-07-04: 12:00:00 via INTRAVENOUS

## 2017-07-04 MED ORDER — DIPHENHYDRAMINE HCL 12.5 MG/5ML PO ELIX: 12.5000 mg | ORAL_SOLUTION | ORAL | Status: DC | PRN

## 2017-07-04 MED ORDER — ONDANSETRON HCL 4 MG/2ML IJ SOLN
INTRAMUSCULAR | Status: AC
  Filled 2017-07-04: qty 2

## 2017-07-04 MED ORDER — ONDANSETRON HCL 4 MG/2ML IJ SOLN
INTRAMUSCULAR | Status: DC | PRN
  Administered 2017-07-04: 4 mg via INTRAVENOUS

## 2017-07-04 MED ORDER — ACETAMINOPHEN 10 MG/ML IV SOLN
1000.0000 mg | Freq: Once | INTRAVENOUS | Status: AC
  Administered 2017-07-04: 1000 mg via INTRAVENOUS

## 2017-07-04 MED ORDER — DEXAMETHASONE SODIUM PHOSPHATE 10 MG/ML IJ SOLN
INTRAMUSCULAR | Status: AC
  Filled 2017-07-04: qty 1

## 2017-07-04 MED ORDER — ACETAMINOPHEN 500 MG PO TABS
1000.0000 mg | ORAL_TABLET | Freq: Four times a day (QID) | ORAL | Status: AC
  Administered 2017-07-04 – 2017-07-05 (×4): 1000 mg via ORAL
  Filled 2017-07-04 (×4): qty 2

## 2017-07-04 MED ORDER — ACETAMINOPHEN 650 MG RE SUPP: 650.0000 mg | RECTAL | Status: DC | PRN

## 2017-07-04 MED ORDER — METOCLOPRAMIDE HCL 5 MG/ML IJ SOLN: 5.0000 mg | Freq: Three times a day (TID) | INTRAMUSCULAR | Status: DC | PRN

## 2017-07-04 MED ORDER — SODIUM CHLORIDE 0.9 % IJ SOLN
INTRAMUSCULAR | Status: DC | PRN
Start: 2017-07-04 — End: 2017-07-04
  Administered 2017-07-04: 60 mL

## 2017-07-04 MED ORDER — ACETAMINOPHEN 10 MG/ML IV SOLN
INTRAVENOUS | Status: AC
  Filled 2017-07-04: qty 100

## 2017-07-04 MED ORDER — CHLORHEXIDINE GLUCONATE 4 % EX LIQD: 60.0000 mL | Freq: Once | CUTANEOUS | Status: DC

## 2017-07-04 MED ORDER — BUPIVACAINE HCL (PF) 0.25 % IJ SOLN
INTRAMUSCULAR | Status: AC
  Filled 2017-07-04: qty 30

## 2017-07-04 MED ORDER — SODIUM CHLORIDE 0.9 % IJ SOLN
INTRAMUSCULAR | Status: AC
  Filled 2017-07-04: qty 10

## 2017-07-04 MED ORDER — ALBUMIN HUMAN 5 % IV SOLN
INTRAVENOUS | Status: AC
  Filled 2017-07-04: qty 250

## 2017-07-04 MED ORDER — SODIUM CHLORIDE 0.9 % IV SOLN
INTRAVENOUS | Status: DC
Start: 2017-07-04 — End: 2017-07-05
  Administered 2017-07-04 – 2017-07-05 (×2): via INTRAVENOUS

## 2017-07-04 MED ORDER — FENTANYL CITRATE (PF) 100 MCG/2ML IJ SOLN: 25.0000 ug | INTRAMUSCULAR | Status: DC | PRN

## 2017-07-04 MED ORDER — FENTANYL CITRATE (PF) 100 MCG/2ML IJ SOLN
INTRAMUSCULAR | Status: DC | PRN
  Administered 2017-07-04: 50 ug via INTRAVENOUS

## 2017-07-04 MED ORDER — FENTANYL CITRATE (PF) 100 MCG/2ML IJ SOLN
50.0000 ug | INTRAMUSCULAR | Status: DC | PRN
  Administered 2017-07-04: 50 ug via INTRAVENOUS

## 2017-07-04 MED ORDER — METHOCARBAMOL 1000 MG/10ML IJ SOLN
500.0000 mg | Freq: Four times a day (QID) | INTRAVENOUS | Status: DC | PRN
  Filled 2017-07-04: qty 5

## 2017-07-04 MED ORDER — ROPIVACAINE HCL 7.5 MG/ML IJ SOLN
INTRAMUSCULAR | Status: DC | PRN
  Administered 2017-07-04: 20 mL via PERINEURAL

## 2017-07-04 MED ORDER — MORPHINE SULFATE (PF) 4 MG/ML IV SOLN: 1.0000 mg | INTRAVENOUS | Status: DC | PRN

## 2017-07-04 MED ORDER — RIVAROXABAN 10 MG PO TABS
10.0000 mg | ORAL_TABLET | Freq: Every day | ORAL | Status: DC
  Administered 2017-07-05: 10 mg via ORAL
  Filled 2017-07-04: qty 1

## 2017-07-04 SURGICAL SUPPLY — 50 items
BAG DECANTER FOR FLEXI CONT (MISCELLANEOUS) IMPLANT
BAG ZIPLOCK 12X15 (MISCELLANEOUS) ×3 IMPLANT
BANDAGE ACE 6X5 VEL STRL LF (GAUZE/BANDAGES/DRESSINGS) ×3 IMPLANT
BLADE SAG 18X100X1.27 (BLADE) ×3 IMPLANT
BLADE SAW SGTL 11.0X1.19X90.0M (BLADE) ×3 IMPLANT
BOWL SMART MIX CTS (DISPOSABLE) ×3 IMPLANT
CAPT KNEE TOTAL 3 ATTUNE ×3 IMPLANT
CEMENT HV SMART SET (Cement) ×6 IMPLANT
CLOSURE WOUND 1/2 X4 (GAUZE/BANDAGES/DRESSINGS) ×2
COVER SURGICAL LIGHT HANDLE (MISCELLANEOUS) ×3 IMPLANT
CUFF TOURN SGL QUICK 34 (TOURNIQUET CUFF) ×2
CUFF TRNQT CYL 34X4X40X1 (TOURNIQUET CUFF) ×1 IMPLANT
DECANTER SPIKE VIAL GLASS SM (MISCELLANEOUS) ×3 IMPLANT
DRAPE U-SHAPE 47X51 STRL (DRAPES) ×3 IMPLANT
DRSG ADAPTIC 3X8 NADH LF (GAUZE/BANDAGES/DRESSINGS) ×3 IMPLANT
DRSG PAD ABDOMINAL 8X10 ST (GAUZE/BANDAGES/DRESSINGS) IMPLANT
DURAPREP 26ML APPLICATOR (WOUND CARE) ×3 IMPLANT
ELECT REM PT RETURN 15FT ADLT (MISCELLANEOUS) ×3 IMPLANT
EVACUATOR 1/8 PVC DRAIN (DRAIN) ×3 IMPLANT
GAUZE SPONGE 4X4 12PLY STRL (GAUZE/BANDAGES/DRESSINGS) ×3 IMPLANT
GLOVE BIO SURGEON STRL SZ7.5 (GLOVE) IMPLANT
GLOVE BIO SURGEON STRL SZ8 (GLOVE) ×3 IMPLANT
GLOVE BIOGEL PI IND STRL 6.5 (GLOVE) IMPLANT
GLOVE BIOGEL PI IND STRL 8 (GLOVE) ×1 IMPLANT
GLOVE BIOGEL PI INDICATOR 6.5 (GLOVE)
GLOVE BIOGEL PI INDICATOR 8 (GLOVE) ×2
GLOVE SURG SS PI 6.5 STRL IVOR (GLOVE) IMPLANT
GOWN STRL REUS W/TWL LRG LVL3 (GOWN DISPOSABLE) ×3 IMPLANT
GOWN STRL REUS W/TWL XL LVL3 (GOWN DISPOSABLE) IMPLANT
HANDPIECE INTERPULSE COAX TIP (DISPOSABLE) ×2
IMMOBILIZER KNEE 20 (SOFTGOODS) ×3 IMPLANT
IMMOBILIZER KNEE 20 THIGH 36 (SOFTGOODS) IMPLANT
MANIFOLD NEPTUNE II (INSTRUMENTS) ×3 IMPLANT
NS IRRIG 1000ML POUR BTL (IV SOLUTION) ×3 IMPLANT
PACK TOTAL KNEE CUSTOM (KITS) ×3 IMPLANT
PAD ABD 8X10 STRL (GAUZE/BANDAGES/DRESSINGS) ×3 IMPLANT
PADDING CAST COTTON 6X4 STRL (CAST SUPPLIES) ×6 IMPLANT
POSITIONER SURGICAL ARM (MISCELLANEOUS) ×3 IMPLANT
SET HNDPC FAN SPRY TIP SCT (DISPOSABLE) ×1 IMPLANT
STRIP CLOSURE SKIN 1/2X4 (GAUZE/BANDAGES/DRESSINGS) ×4 IMPLANT
SUT MNCRL AB 4-0 PS2 18 (SUTURE) ×3 IMPLANT
SUT STRATAFIX 0 PDS 27 VIOLET (SUTURE) ×3
SUT VIC AB 2-0 CT1 27 (SUTURE) ×6
SUT VIC AB 2-0 CT1 TAPERPNT 27 (SUTURE) ×3 IMPLANT
SUTURE STRATFX 0 PDS 27 VIOLET (SUTURE) ×1 IMPLANT
SYR 30ML LL (SYRINGE) ×3 IMPLANT
TRAY FOLEY W/METER SILVER 16FR (SET/KITS/TRAYS/PACK) ×3 IMPLANT
WATER STERILE IRR 1000ML POUR (IV SOLUTION) ×6 IMPLANT
WRAP KNEE MAXI GEL POST OP (GAUZE/BANDAGES/DRESSINGS) ×3 IMPLANT
YANKAUER SUCT BULB TIP 10FT TU (MISCELLANEOUS) ×3 IMPLANT

## 2017-07-04 NOTE — Anesthesia Procedure Notes (Signed)
Anesthesia Regional Block: Adductor canal block   Pre-Anesthetic Checklist: ,, timeout performed, Correct Patient, Correct Site, Correct Laterality, Correct Procedure, Correct Position, site marked, Risks and benefits discussed,  Surgical consent,  Pre-op evaluation,  At surgeon's request and post-op pain management  Laterality: Right  Prep: chloraprep       Needles:  Injection technique: Single-shot  Needle Type: Echogenic Needle     Needle Length: 9cm  Needle Gauge: 21     Additional Needles:   Procedures:,,,, ultrasound used (permanent image in chart),,,,  Narrative:  Start time: 07/04/2017 10:03 AM End time: 07/04/2017 10:08 AM Injection made incrementally with aspirations every 5 mL.  Performed by: Personally  Anesthesiologist: Catalina Gravel, MD  Additional Notes: No pain on injection. No increased resistance to injection. Injection made in 5cc increments.  Good needle visualization.  Patient tolerated procedure well.

## 2017-07-04 NOTE — Transfer of Care (Signed)
Immediate Anesthesia Transfer of Care Note  Patient: Jordan Lopez  Procedure(s) Performed: RIGHT TOTAL KNEE ARTHROPLASTY (Right Knee)  Patient Location: PACU  Anesthesia Type:Spinal  Level of Consciousness: awake, alert  and oriented  Airway & Oxygen Therapy: Patient Spontanous Breathing and Patient connected to face mask oxygen  Post-op Assessment: Report given to RN and Post -op Vital signs reviewed and stable  Post vital signs: Reviewed and stable  Last Vitals:  Vitals:   07/04/17 1016 07/04/17 1017  BP: 103/70   Pulse: (!) 52 (!) 55  Resp: 13 13  Temp:    SpO2: 94% 94%    Last Pain:  Vitals:   07/04/17 0855  TempSrc: Oral      Patients Stated Pain Goal: 4 (62/70/35 0093)  Complications: No apparent anesthesia complications

## 2017-07-04 NOTE — Anesthesia Preprocedure Evaluation (Signed)
Anesthesia Evaluation  Patient identified by MRN, date of birth, ID band Patient awake    Reviewed: Allergy & Precautions, NPO status , Patient's Chart, lab work & pertinent test results  History of Anesthesia Complications (+) PONV and history of anesthetic complications  Airway Mallampati: II  TM Distance: >3 FB Neck ROM: Full    Dental  (+) Teeth Intact, Dental Advisory Given   Pulmonary neg pulmonary ROS,    Pulmonary exam normal breath sounds clear to auscultation       Cardiovascular Exercise Tolerance: Good negative cardio ROS Normal cardiovascular exam Rhythm:Regular Rate:Normal     Neuro/Psych negative neurological ROS  negative psych ROS   GI/Hepatic negative GI ROS, Neg liver ROS,   Endo/Other  Obesity   Renal/GU negative Renal ROS     Musculoskeletal  (+) Arthritis , Osteoarthritis,    Abdominal   Peds  Hematology negative hematology ROS (+) Plt 152k   Anesthesia Other Findings Day of surgery medications reviewed with the patient.  Reproductive/Obstetrics                             Anesthesia Physical Anesthesia Plan  ASA: II  Anesthesia Plan: Spinal   Post-op Pain Management:  Regional for Post-op pain   Induction:   PONV Risk Score and Plan: 2 and Propofol infusion, Midazolam and Ondansetron  Airway Management Planned: Simple Face Mask  Additional Equipment:   Intra-op Plan:   Post-operative Plan:   Informed Consent: I have reviewed the patients History and Physical, chart, labs and discussed the procedure including the risks, benefits and alternatives for the proposed anesthesia with the patient or authorized representative who has indicated his/her understanding and acceptance.   Dental advisory given  Plan Discussed with: CRNA, Anesthesiologist and Surgeon  Anesthesia Plan Comments: (Discussed risks and benefits of and differences between spinal and  general. Discussed risks of spinal including headache, backache, failure, bleeding, infection, and nerve damage. Patient consents to spinal. Questions answered. Coagulation studies and platelet count acceptable.)        Anesthesia Quick Evaluation

## 2017-07-04 NOTE — Progress Notes (Signed)
Assisted Dr. Turk with right, ultrasound guided, adductor canal block. Side rails up, monitors on throughout procedure. See vital signs in flow sheet. Tolerated Procedure well.  

## 2017-07-04 NOTE — Interval H&P Note (Signed)
History and Physical Interval Note:  07/04/2017 9:23 AM  Jordan Lopez  has presented today for surgery, with the diagnosis of Oteoarthritis Right knee  The various methods of treatment have been discussed with the patient and family. After consideration of risks, benefits and other options for treatment, the patient has consented to  Procedure(s): RIGHT TOTAL KNEE ARTHROPLASTY (Right) as a surgical intervention .  The patient's history has been reviewed, patient examined, no change in status, stable for surgery.  I have reviewed the patient's chart and labs.  Questions were answered to the patient's satisfaction.     Gearlean Alf

## 2017-07-04 NOTE — Anesthesia Procedure Notes (Signed)
Spinal  Patient location during procedure: OR Start time: 07/04/2017 10:30 AM End time: 07/04/2017 10:33 AM Staffing Anesthesiologist: Catalina Gravel, MD Performed: anesthesiologist  Preanesthetic Checklist Completed: patient identified, surgical consent, pre-op evaluation, timeout performed, IV checked, risks and benefits discussed and monitors and equipment checked Spinal Block Patient position: sitting Prep: site prepped and draped and DuraPrep Patient monitoring: continuous pulse ox and blood pressure Approach: midline Location: L3-4 Injection technique: single-shot Needle Needle type: Pencan  Needle gauge: 24 G Additional Notes Functioning IV was confirmed and monitors were applied. Sterile prep and drape, including hand hygiene, mask and sterile gloves were used. The patient was positioned and the spine was prepped. The skin was anesthetized with lidocaine.  Free flow of clear CSF was obtained prior to injecting local anesthetic into the CSF.  The spinal needle aspirated freely following injection.  The needle was carefully withdrawn.  The patient tolerated the procedure well. Consent was obtained prior to procedure with all questions answered and concerns addressed. Risks including but not limited to bleeding, infection, nerve damage, paralysis, failed block, inadequate analgesia, allergic reaction, high spinal, itching and headache were discussed and the patient wished to proceed.   Hoy Morn, MD

## 2017-07-04 NOTE — Op Note (Signed)
OPERATIVE REPORT-TOTAL KNEE ARTHROPLASTY   Pre-operative diagnosis- Osteoarthritis  Right knee(s)  Post-operative diagnosis- Osteoarthritis Right knee(s)  Procedure-  Right  Total Knee Arthroplasty  Surgeon- Dione Plover. Janiene Aarons, MD  Assistant- Arlee Muslim, PA-C   Anesthesia-  Adductor canal block and spinal  EBL-25 mL   Drains Hemovac  Tourniquet time- 36 minutes @ 621 mm Hg  Complications- None  Condition-PACU - hemodynamically stable.   Brief Clinical Note  Jordan Lopez is a 56 y.o. year old male with end stage OA of his right knee with progressively worsening pain and dysfunction. He has constant pain, with activity and at rest and significant functional deficits with difficulties even with ADLs. He has had extensive non-op management including analgesics, injections of cortisone and viscosupplements, and home exercise program, but remains in significant pain with significant dysfunction. Radiographs show bone on bone arthritis medial and patellofemoral. He presents now for right Total Knee Arthroplasty.    Procedure in detail---   The patient is brought into the operating room and positioned supine on the operating table. After successful administration of  Adductor canal block and spinal,   a tourniquet is placed high on the  Right thigh(s) and the lower extremity is prepped and draped in the usual sterile fashion. Time out is performed by the operating team and then the  Right lower extremity is wrapped in Esmarch, knee flexed and the tourniquet inflated to 300 mmHg.       A midline incision is made with a ten blade through the subcutaneous tissue to the level of the extensor mechanism. A fresh blade is used to make a medial parapatellar arthrotomy. Soft tissue over the proximal medial tibia is subperiosteally elevated to the joint line with a knife and into the semimembranosus bursa with a Cobb elevator. Soft tissue over the proximal lateral tibia is elevated with attention  being paid to avoiding the patellar tendon on the tibial tubercle. The patella is everted, knee flexed 90 degrees and the ACL and PCL are removed. Findings are bone on bone medial and patellofemoral with large global osteophytes.        The drill is used to create a starting hole in the distal femur and the canal is thoroughly irrigated with sterile saline to remove the fatty contents. The 5 degree Right  valgus alignment guide is placed into the femoral canal and the distal femoral cutting block is pinned to remove 9 mm off the distal femur. Resection is made with an oscillating saw.      The tibia is subluxed forward and the menisci are removed. The extramedullary alignment guide is placed referencing proximally at the medial aspect of the tibial tubercle and distally along the second metatarsal axis and tibial crest. The block is pinned to remove 110mm off the more deficient medial  side. Resection is made with an oscillating saw. Size 6is the most appropriate size for the tibia and the proximal tibia is prepared with the modular drill and keel punch for that size.      The femoral sizing guide is placed and size 7 is most appropriate. Rotation is marked off the epicondylar axis and confirmed by creating a rectangular flexion gap at 90 degrees. The size 7 cutting block is pinned in this rotation and the anterior, posterior and chamfer cuts are made with the oscillating saw. The intercondylar block is then placed and that cut is made.      Trial size 6 tibial component, trial size 7 posterior stabilized  femur and a 7  mm posterior stabilized rotating platform insert trial is placed. Full extension is achieved with excellent varus/valgus and anterior/posterior balance throughout full range of motion. The patella is everted and thickness measured to be 24  mm. Free hand resection is taken to 14 mm, a 38 template is placed, lug holes are drilled, trial patella is placed, and it tracks normally. Osteophytes are  removed off the posterior femur with the trial in place. All trials are removed and the cut bone surfaces prepared with pulsatile lavage. Cement is mixed and once ready for implantation, the size 6 tibial implant, size  7 posterior stabilized femoral component, and the size 38 patella are cemented in place and the patella is held with the clamp. The trial insert is placed and the knee held in full extension. The Exparel (20 ml mixed with 60 ml saline) is injected into the extensor mechanism, posterior capsule, medial and lateral gutters and subcutaneous tissues.  All extruded cement is removed and once the cement is hard the permanent 7 mm posterior stabilized rotating platform insert is placed into the tibial tray.      The wound is copiously irrigated with saline solution and the extensor mechanism closed over a hemovac drain with #1 V-loc suture. The tourniquet is released for a total tourniquet time of 36  minutes. Flexion against gravity is 140 degrees and the patella tracks normally. Subcutaneous tissue is closed with 2.0 vicryl and subcuticular with running 4.0 Monocryl. The incision is cleaned and dried and steri-strips and a bulky sterile dressing are applied. The limb is placed into a knee immobilizer and the patient is awakened and transported to recovery in stable condition.      Please note that a surgical assistant was a medical necessity for this procedure in order to perform it in a safe and expeditious manner. Surgical assistant was necessary to retract the ligaments and vital neurovascular structures to prevent injury to them and also necessary for proper positioning of the limb to allow for anatomic placement of the prosthesis.   Dione Plover Jordan Jewkes, MD    07/04/2017, 11:36 AM

## 2017-07-04 NOTE — Anesthesia Postprocedure Evaluation (Signed)
Anesthesia Post Note  Patient: Jordan Lopez  Procedure(s) Performed: RIGHT TOTAL KNEE ARTHROPLASTY (Right Knee)     Patient location during evaluation: PACU Anesthesia Type: Spinal Level of consciousness: oriented and awake and alert Pain management: pain level controlled Vital Signs Assessment: post-procedure vital signs reviewed and stable Respiratory status: spontaneous breathing, respiratory function stable and patient connected to nasal cannula oxygen Cardiovascular status: blood pressure returned to baseline and stable Postop Assessment: no headache, no backache, no apparent nausea or vomiting and spinal receding Anesthetic complications: no    Last Vitals:  Vitals:   07/04/17 1533 07/04/17 1627  BP: 104/61 106/60  Pulse: (!) 55 (!) 57  Resp: 16 16  Temp: 36.7 C 36.7 C  SpO2: 100% 99%    Last Pain:  Vitals:   07/04/17 1655  TempSrc:   PainSc: Asleep                 Catalina Gravel

## 2017-07-04 NOTE — Discharge Instructions (Addendum)
° °Dr. Frank Aluisio °Total Joint Specialist °[facility] °[address] °[place], [place] [address] °[phone] ° °TOTAL KNEE REPLACEMENT POSTOPERATIVE DIRECTIONS ° °Knee Rehabilitation, Guidelines Following Surgery  °Results after knee surgery are often greatly improved when you follow the exercise, range of motion and muscle strengthening exercises prescribed by your doctor. Safety measures are also important to protect the knee from further injury. Any time any of these exercises cause you to have increased pain or swelling in your knee joint, decrease the amount until you are comfortable again and slowly increase them. If you have problems or questions, call your caregiver or physical therapist for advice.  ° °HOME CARE INSTRUCTIONS  °Remove items at home which could result in a fall. This includes throw rugs or furniture in walking pathways.  °· ICE to the affected knee every three hours for 30 minutes at a time and then as needed for pain and swelling.  Continue to use ice on the knee for pain and swelling from surgery. You may notice swelling that will progress down to the foot and ankle.  This is normal after surgery.  Elevate the leg when you are not up walking on it.   °· Continue to use the breathing machine which will help keep your temperature down.  It is common for your temperature to cycle up and down following surgery, especially at night when you are not up moving around and exerting yourself.  The breathing machine keeps your lungs expanded and your temperature down. °· Do not place pillow under knee, focus on keeping the knee straight while resting ° °DIET °You may resume your previous home diet once your are discharged from the hospital. ° °DRESSING / WOUND CARE / SHOWERING °You may shower 3 days after surgery, but keep the wounds dry during showering.  You may use an occlusive plastic wrap (Press'n Seal for example), NO SOAKING/SUBMERGING IN THE BATHTUB.  If the  bandage gets wet, change with a clean dry gauze.  If the incision gets wet, pat the wound dry with a clean towel. °You may start showering once you are discharged home but do not submerge the incision under water. Just pat the incision dry and apply a dry gauze dressing on daily. °Change the surgical dressing daily and reapply a dry dressing each time. ° °ACTIVITY °Walk with your walker as instructed. °Use walker as long as suggested by your caregivers. °Avoid periods of inactivity such as sitting longer than an hour when not asleep. This helps prevent blood clots.  °You may resume a sexual relationship in one month or when given the OK by your doctor.  °You may return to work once you are cleared by your doctor.  °Do not drive a car for 6 weeks or until released by you surgeon.  °Do not drive while taking narcotics. ° °WEIGHT BEARING °Weight bearing as tolerated with assist device (walker, cane, etc) as directed, use it as long as suggested by your surgeon or therapist, typically at least 4-6 weeks. ° °POSTOPERATIVE CONSTIPATION PROTOCOL °Constipation - defined medically as fewer than three stools per week and severe constipation as less than one stool per week. ° °One of the most common issues patients have following surgery is constipation.  Even if you have a regular bowel pattern at home, your normal regimen is likely to be disrupted due to multiple reasons following surgery.  Combination of anesthesia, postoperative narcotics, change in appetite and fluid intake all can affect your bowels.    In order to avoid complications following surgery, here are some recommendations in order to help you during your recovery period. ° °Colace (docusate) - Pick up an over-the-counter form of Colace or another stool softener and take twice a day as long as you are requiring postoperative pain medications.  Take with a full glass of water daily.  If you experience loose stools or diarrhea, hold the colace until you stool forms  back up.  If your symptoms do not get better within 1 week or if they get worse, check with your doctor. ° °Dulcolax (bisacodyl) - Pick up over-the-counter and take as directed by the product packaging as needed to assist with the movement of your bowels.  Take with a full glass of water.  Use this product as needed if not relieved by Colace only.  ° °MiraLax (polyethylene glycol) - Pick up over-the-counter to have on hand.  MiraLax is a solution that will increase the amount of water in your bowels to assist with bowel movements.  Take as directed and can mix with a glass of water, juice, soda, coffee, or tea.  Take if you go more than two days without a movement. °Do not use MiraLax more than once per day. Call your doctor if you are still constipated or irregular after using this medication for 7 days in a row. ° °If you continue to have problems with postoperative constipation, please contact the office for further assistance and recommendations.  If you experience "the worst abdominal pain ever" or develop nausea or vomiting, please contact the office immediatly for further recommendations for treatment. ° °ITCHING ° If you experience itching with your medications, try taking only a single pain pill, or even half a pain pill at a time.  You can also use Benadryl over the counter for itching or also to help with sleep.  ° °TED HOSE STOCKINGS °Wear the elastic stockings on both legs for three weeks following surgery during the day but you may remove then at night for sleeping. ° °MEDICATIONS °See your medication summary on the “After Visit Summary” that the nursing staff will review with you prior to discharge.  You may have some home medications which will be placed on hold until you complete the course of blood thinner medication.  It is important for you to complete the blood thinner medication as prescribed by your surgeon.  Continue your approved medications as instructed at time of  discharge. ° °PRECAUTIONS °If you experience chest pain or shortness of breath - call 911 immediately for transfer to the hospital emergency department.  °If you develop a fever greater that 101 F, purulent drainage from wound, increased redness or drainage from wound, foul odor from the wound/dressing, or calf pain - CONTACT YOUR SURGEON.   °                                                °FOLLOW-UP APPOINTMENTS °Make sure you keep all of your appointments after your operation with your surgeon and caregivers. You should call the office at the above phone number and make an appointment for approximately two weeks after the date of your surgery or on the date instructed by your surgeon outlined in the "After Visit Summary". ° ° °RANGE OF MOTION AND STRENGTHENING EXERCISES  °Rehabilitation of the knee is important following a knee injury or   an operation. After just a few days of immobilization, the muscles of the thigh which control the knee become weakened and shrink (atrophy). Knee exercises are designed to build up the tone and strength of the thigh muscles and to improve knee motion. Often times heat used for twenty to thirty minutes before working out will loosen up your tissues and help with improving the range of motion but do not use heat for the first two weeks following surgery. These exercises can be done on a training (exercise) mat, on the floor, on a table or on a bed. Use what ever works the best and is most comfortable for you Knee exercises include:  °Leg Lifts - While your knee is still immobilized in a splint or cast, you can do straight leg raises. Lift the leg to 60 degrees, hold for 3 sec, and slowly lower the leg. Repeat 10-20 times 2-3 times daily. Perform this exercise against resistance later as your knee gets better.  °Quad and Hamstring Sets - Tighten up the muscle on the front of the thigh (Quad) and hold for 5-10 sec. Repeat this 10-20 times hourly. Hamstring sets are done by pushing the  foot backward against an object and holding for 5-10 sec. Repeat as with quad sets.  °· Leg Slides: Lying on your back, slowly slide your foot toward your buttocks, bending your knee up off the floor (only go as far as is comfortable). Then slowly slide your foot back down until your leg is flat on the floor again. °· Angel Wings: Lying on your back spread your legs to the side as far apart as you can without causing discomfort.  °A rehabilitation program following serious knee injuries can speed recovery and prevent re-injury in the future due to weakened muscles. Contact your doctor or a physical therapist for more information on knee rehabilitation.  ° °IF YOU ARE TRANSFERRED TO A SKILLED REHAB FACILITY °If the patient is transferred to a skilled rehab facility following release from the hospital, a list of the current medications will be sent to the facility for the patient to continue.  When discharged from the skilled rehab facility, please have the facility set up the patient's Home Health Physical Therapy prior to being released. Also, the skilled facility will be responsible for providing the patient with their medications at time of release from the facility to include their pain medication, the muscle relaxants, and their blood thinner medication. If the patient is still at the rehab facility at time of the two week follow up appointment, the skilled rehab facility will also need to assist the patient in arranging follow up appointment in our office and any transportation needs. ° °MAKE SURE YOU:  °Understand these instructions.  °Get help right away if you are not doing well or get worse.  ° ° °Pick up stool softner and laxative for home use following surgery while on pain medications. °Do not submerge incision under water. °Please use good hand washing techniques while changing dressing each day. °May shower starting three days after surgery. °Please use a clean towel to pat the incision dry following  showers. °Continue to use ice for pain and swelling after surgery. °Do not use any lotions or creams on the incision until instructed by your surgeon. ° °Take Xarelto for two and a half more weeks following discharge from the hospital, then discontinue Xarelto. °Once the patient has completed the blood thinner regimen, then take a Baby 81 mg Aspirin daily for three   more weeks.    Information on my medicine - XARELTO (Rivaroxaban)  This medication education was reviewed with me or my healthcare representative as part of my discharge preparation.  The pharmacist that spoke with me during my hospital stay was:  Kara Mead, [facility]  Why was Xarelto prescribed for you? Xarelto was prescribed for you to reduce the risk of blood clots forming after orthopedic surgery. The medical term for these abnormal blood clots is venous thromboembolism (VTE).  What do you need to know about xarelto ? Take your Xarelto ONCE DAILY at the same time every day. You may take it either with or without food.  If you have difficulty swallowing the tablet whole, you may crush it and mix in applesauce just prior to taking your dose.  Take Xarelto exactly as prescribed by your doctor and DO NOT stop taking Xarelto without talking to the doctor who prescribed the medication.  Stopping without other VTE prevention medication to take the place of Xarelto may increase your risk of developing a clot.  After discharge, you should have regular check-up appointments with your healthcare provider that is prescribing your Xarelto.    What do you do if you miss a dose? If you miss a dose, take it as soon as you remember on the same day then continue your regularly scheduled once daily regimen the next day. Do not take two doses of Xarelto on the same day.   Important Safety Information A possible side effect of Xarelto is bleeding. You should call your healthcare provider right away if you experience any of the  following: ? Bleeding from an injury or your nose that does not stop. ? Unusual colored urine (red or dark brown) or unusual colored stools (red or black). ? Unusual bruising for unknown reasons. ? A serious fall or if you hit your head (even if there is no bleeding).  Some medicines may interact with Xarelto and might increase your risk of bleeding while on Xarelto. To help avoid this, consult your healthcare provider or pharmacist prior to using any new prescription or non-prescription medications, including herbals, vitamins, non-steroidal anti-inflammatory drugs (NSAIDs) and supplements.  This website has more information on Xarelto: [email or web address]

## 2017-07-05 LAB — BASIC METABOLIC PANEL
Anion gap: 7 (ref 5–15)
BUN: 13 mg/dL (ref 6–20)
CO2: 26 mmol/L (ref 22–32)
Calcium: 8.9 mg/dL (ref 8.9–10.3)
Chloride: 106 mmol/L (ref 101–111)
Creatinine, Ser: 0.93 mg/dL (ref 0.61–1.24)
GFR calc Af Amer: >60 mL/min (ref >60)
GFR calc non Af Amer: >60 mL/min (ref >60)
Glucose, Bld: 123 mg/dL — ABNORMAL HIGH (ref 65–99)
Potassium: 4 mmol/L (ref 3.5–5.1)
Sodium: 139 mmol/L (ref 135–145)

## 2017-07-05 LAB — CBC
HCT: 41.3 % (ref 39.0–52.0)
Hemoglobin: 14 g/dL (ref 13.0–17.0)
MCH: 29.4 pg (ref 26.0–34.0)
MCHC: 33.9 g/dL (ref 30.0–36.0)
MCV: 86.8 fL (ref 78.0–100.0)
Platelets: 163 10*3/uL (ref 150–400)
RBC: 4.76 MIL/uL (ref 4.22–5.81)
RDW: 12.8 % (ref 11.5–15.5)
WBC: 14.3 10*3/uL — ABNORMAL HIGH (ref 4.0–10.5)

## 2017-07-05 MED ORDER — OXYCODONE HCL 5 MG PO TABS: 5.0000 mg | ORAL_TABLET | ORAL | 0 refills | Status: DC | PRN

## 2017-07-05 MED ORDER — METHOCARBAMOL 500 MG PO TABS: 500.0000 mg | ORAL_TABLET | Freq: Four times a day (QID) | ORAL | 0 refills | Status: DC | PRN

## 2017-07-05 MED ORDER — TRAMADOL HCL 50 MG PO TABS: 50.0000 mg | ORAL_TABLET | Freq: Four times a day (QID) | ORAL | 0 refills | Status: DC | PRN

## 2017-07-05 MED ORDER — RIVAROXABAN 10 MG PO TABS: 10.0000 mg | ORAL_TABLET | Freq: Every day | ORAL | 0 refills | Status: DC

## 2017-07-05 NOTE — Progress Notes (Signed)
Reviewed discharge instructions with patient and family. All questions answered. All belongings, instructions, prescriptions, equipment with patient. Patient reports still having pain. Discussed pain management options and pain control for home. Patient reports he still wants to go home and will notify MD if pain continues.

## 2017-07-05 NOTE — Evaluation (Signed)
Physical Therapy Evaluation Patient Details Name: Jordan Lopez MRN: [id number] DOB: [birth year] Today's Date: 07/05/2017   History of Present Illness  56 yo male s/p R TKA 07/04/17  Clinical Impression  On eval, pt was Min guard assist for mobility. He walked ~135 feet with a RW. Pain rated 5/10 with activity. Will follow and progress activity as tolerated. D/C plan is for OP PT to begin on Friday. Will plan to have a 2nd session to practice stair negotiation prior to possible d/c later today.     Follow Up Recommendations DC plan and follow up therapy as arranged by surgeon(OP PT)    Equipment Recommendations  Rolling walker with 5" wheels    Recommendations for Other Services       Precautions / Restrictions Precautions Precautions: Fall Required Braces or Orthoses: Knee Immobilizer - Right Knee Immobilizer - Right: Discontinue once straight leg raise with < 10 degree lag (KI not used. Pt able to perform SLR) Restrictions Weight Bearing Restrictions: No RLE Weight Bearing: Weight bearing as tolerated      Mobility  Bed Mobility               General bed mobility comments: oob in recliner  Transfers Overall transfer level: Needs assistance Equipment used: Rolling walker (2 wheeled) Transfers: Sit to/from Stand Sit to Stand: Min guard         General transfer comment: close guard for safety. VCs safety, technique, hand/LE placement  Ambulation/Gait Ambulation/Gait assistance: Min guard Ambulation Distance (Feet): 135 Feet Assistive device: Rolling walker (2 wheeled) Gait Pattern/deviations: Step-to pattern;Step-through pattern;Decreased stride length;Decreased step length - right     General Gait Details: close guard for safety. VCs safety, sequence, safe use of RW.   Stairs            Wheelchair Mobility    Modified Rankin (Stroke Patients Only)       Balance                                             Pertinent  Vitals/Pain Pain Assessment: 0-10 Pain Score: 5  Pain Location: r knee Pain Descriptors / Indicators: Aching;Sore Pain Intervention(s): Monitored during session;Repositioned;Ice applied    Home Living Family/patient expects to be discharged to:: Private residence Living Arrangements: Spouse/significant other Available Help at Discharge: Family Type of Home: House Home Access: Stairs to enter Entrance Stairs-Rails: None Entrance Stairs-Number of Steps: 2. has a post nearby Home Layout: Multi-level Home Equipment: Crutches;Cane - single point      Prior Function Level of Independence: Independent               Hand Dominance        Extremity/Trunk Assessment   Upper Extremity Assessment Upper Extremity Assessment: Defer to OT evaluation    Lower Extremity Assessment Lower Extremity Assessment: Generalized weakness(s/p R TKA)    Cervical / Trunk Assessment Cervical / Trunk Assessment: Normal  Communication   Communication: No difficulties  Cognition Arousal/Alertness: Awake/alert Behavior During Therapy: WFL for tasks assessed/performed Overall Cognitive Status: Within Functional Limits for tasks assessed                                        General Comments      Exercises Total Joint Exercises Ankle Circles/Pumps:  AROM;Both;10 reps;Seated Quad Sets: AROM;Both;10 reps;Seated Hip ABduction/ADduction: AROM;Right;10 reps;Seated Straight Leg Raises: AROM;Right;10 reps;Supine Knee Flexion: AROM;Right;10 reps;Seated Goniometric ROM: ~10-80 degrees   Assessment/Plan    PT Assessment Patient needs continued PT services  PT Problem List Decreased strength;Decreased range of motion;Decreased mobility;Decreased balance;Decreased knowledge of use of DME;Pain       PT Treatment Interventions DME instruction;Gait training;Functional mobility training;Therapeutic activities;Balance training;Therapeutic exercise;Patient/family education    PT  Goals (Current goals can be found in the Care Plan section)  Acute Rehab PT Goals Patient Stated Goal: regain PLOF. Get back to working out at the gym  PT Goal Formulation: With patient Time For Goal Achievement: 07/19/17 Potential to Achieve Goals: Good    Frequency 7X/week   Barriers to discharge        Co-evaluation               AM-PAC PT "6 Clicks" Daily Activity  Outcome Measure Difficulty turning over in bed (including adjusting bedclothes, sheets and blankets)?: A Little Difficulty moving from lying on back to sitting on the side of the bed? : A Little Difficulty sitting down on and standing up from a chair with arms (e.g., wheelchair, bedside commode, etc,.)?: A Little Help needed moving to and from a bed to chair (including a wheelchair)?: A Little Help needed walking in hospital room?: A Little Help needed climbing 3-5 steps with a railing? : A Little 6 Click Score: 18    End of Session Equipment Utilized During Treatment: Gait belt Activity Tolerance: Patient tolerated treatment well Patient left: in chair;with call bell/phone within reach   PT Visit Diagnosis: Muscle weakness (generalized) (M62.81);Difficulty in walking, not elsewhere classified (R26.2)    Time: 0922-0949 PT Time Calculation (min) (ACUTE ONLY): 27 min   Charges:   PT Evaluation $PT Eval Low Complexity: 1 Low PT Treatments $Gait Training: 8-22 mins   PT G Codes:          Weston Anna, MPT Pager: [phone]

## 2017-07-05 NOTE — Evaluation (Signed)
Occupational Therapy Evaluation Patient Details Name: Jordan Lopez MRN: [id number] DOB: [birth year] Today's Date: 07/05/2017    History of Present Illness 56 yo male s/p R TKA 07/04/17   Clinical Impression   Pt is at min A - min guard A level with ADLs and ADL mobility. Pt will have 24/7 assist from his wife at home. All education completed and no further acute OT is indicated at this time    Follow Up Recommendations  DC plan and follow up therapy as arranged by surgeon;No OT follow up    Equipment Recommendations  3 in 1 bedside commode;Other (comment)(ADL A/E for LB)    Recommendations for Other Services       Precautions / Restrictions Precautions Precautions: Fall Required Braces or Orthoses: Knee Immobilizer - Right Knee Immobilizer - Right: Discontinue once straight leg raise with < 10 degree lag Restrictions Weight Bearing Restrictions: No RLE Weight Bearing: Weight bearing as tolerated      Mobility Bed Mobility               General bed mobility comments: oob in recliner upon arrival  Transfers Overall transfer level: Needs assistance Equipment used: Rolling walker (2 wheeled) Transfers: Sit to/from Stand Sit to Stand: Min guard         General transfer comment: close guard for safety. VCs safety, technique, hand/LE placement    Balance Overall balance assessment: Needs assistance Sitting-balance support: No upper extremity supported;Feet supported Sitting balance-Leahy Scale: Good     Standing balance support: No upper extremity supported;During functional activity Standing balance-Leahy Scale: Fair                             ADL either performed or assessed with clinical judgement   ADL Overall ADL's : Needs assistance/impaired     Grooming: Wash/dry hands;Wash/dry face;Standing;Min guard   Upper Body Bathing: Set up;Sitting   Lower Body Bathing: Sit to/from stand   Upper Body Dressing : Set up;Sitting   Lower Body  Dressing: Minimal assistance;Sit to/from stand   Toilet Transfer: Min guard;RW;Comfort height toilet;Grab bars;Ambulation   Toileting- Clothing Manipulation and Hygiene: Min guard;Sit to/from stand   Tub/ Shower Transfer: Min guard;3 in 1;Grab bars;Ambulation;Rolling walker   Functional mobility during ADLs: Min guard;Cueing for sequencing;Cueing for safety General ADL Comments: pt educated on ADL A/E for home use, handout provided     Vision Baseline Vision/History: Wears glasses Wears Glasses: Reading only Patient Visual Report: No change from baseline       Perception     Praxis      Pertinent Vitals/Pain Pain Assessment: 0-10 Pain Score: 2  Pain Location: r knee Pain Descriptors / Indicators: Aching;Sore Pain Intervention(s): Monitored during session;Repositioned;Ice applied;Premedicated before session     Hand Dominance Right   Extremity/Trunk Assessment Upper Extremity Assessment Upper Extremity Assessment: Overall WFL for tasks assessed   Lower Extremity Assessment Lower Extremity Assessment: Defer to PT evaluation   Cervical / Trunk Assessment Cervical / Trunk Assessment: Normal   Communication Communication Communication: No difficulties   Cognition Arousal/Alertness: Awake/alert Behavior During Therapy: WFL for tasks assessed/performed Overall Cognitive Status: Within Functional Limits for tasks assessed                                     General Comments   pt pleasant and cooperative, motivated    Exercises  Shoulder Instructions      Home Living Family/patient expects to be discharged to:: Private residence Living Arrangements: Spouse/significant other Available Help at Discharge: Family Type of Home: House Home Access: Stairs to enter [employer] of Steps: 2. has a post nearby [employer]: None Home Layout: Multi-level Alternate Level Stairs-Number of Steps: 1 step inside   [employer] Shower/Tub:  Occupational psychologist: Standard     Home Equipment: Crutches;Cane - single point          Prior Functioning/Environment Level of Independence: Independent                 OT Problem List: Decreased activity tolerance;Impaired balance (sitting and/or standing);Decreased coordination;Pain;Obesity;Decreased knowledge of use of DME or AE      OT Treatment/Interventions:      OT Goals(Current goals can be found in the care plan section) Acute Rehab OT Goals Patient Stated Goal: regain PLOF. Get back to working out at the gym  OT Goal Formulation: With patient Time For Goal Achievement: 07/12/17 Potential to Achieve Goals: Good  OT Frequency:     Barriers to D/C:    no barriers, will have 24/7 assist from spouse at home       Co-evaluation              AM-PAC PT "6 Clicks" Daily Activity     Outcome Measure Help from another person eating meals?: None Help from another person taking care of personal grooming?: A Little Help from another person toileting, which includes using toliet, bedpan, or urinal?: A Little Help from another person bathing (including washing, rinsing, drying)?: A Little Help from another person to put on and taking off regular upper body clothing?: None Help from another person to put on and taking off regular lower body clothing?: A Little 6 Click Score: 20   End of Session Equipment Utilized During Treatment: Gait belt;Rolling walker;Other (comment)(3 in 1) CPM Right Knee CPM Right Knee: Off Nurse Communication: Mobility status  Activity Tolerance: Patient tolerated treatment well Patient left: in chair  OT Visit Diagnosis: Unsteadiness on feet (R26.81);Pain Pain - Right/Left: Right Pain - part of body: Knee                Time: 1021-1050 OT Time Calculation (min): 29 min Charges:  OT General Charges $OT Visit: 1 Visit OT Evaluation $OT Eval Low Complexity: 1 Low OT Treatments $Therapeutic Activity: 8-22  mins G-Codes: OT G-codes **NOT FOR INPATIENT CLASS** Functional Assessment Tool Used: AM-PAC 6 Clicks Daily Activity     Britt Bottom 07/05/2017, 11:45 AM

## 2017-07-05 NOTE — Progress Notes (Signed)
Physical Therapy Treatment Patient Details Name: Jordan Lopez MRN: [id number] DOB: 04/26/1961 Today's Date: 07/05/2017    History of Present Illness 56 yo male s/p R TKA 07/04/17    PT Comments    2nd session to practice stair negotiation. Practiced/reviewed gait training and stair training. Issued HEP for pt to perform 2x/day until he begins OP PT. Okay to d/c from PT standpoint-made covering nurse Luellen Pucker answered phone for caitlyn) aware. Also notified front desk that pt requested pain meds. All education completed.     Follow Up Recommendations  DC plan and follow up therapy as arranged by surgeon(OP PT)     Equipment Recommendations  Rolling walker with 5" wheels    Recommendations for Other Services       Precautions / Restrictions Precautions Precautions: Fall Required Braces or Orthoses: Knee Immobilizer - Right Knee Immobilizer - Right: Discontinue once straight leg raise with < 10 degree lag Restrictions Weight Bearing Restrictions: No RLE Weight Bearing: Weight bearing as tolerated    Mobility  Bed Mobility               General bed mobility comments: oob in recliner upon arrival  Transfers Overall transfer level: Needs assistance Equipment used: Rolling walker (2 wheeled) Transfers: Sit to/from Stand Sit to Stand: Supervision         General transfer comment:  VCs safety, technique, hand/LE placement  Ambulation/Gait Ambulation/Gait assistance: Min guard Ambulation Distance (Feet): 100 Feet Assistive device: Rolling walker (2 wheeled) Gait Pattern/deviations: Step-to pattern;Antalgic;Decreased stance time - right;Decreased step length - left     General Gait Details: close guard for safety. VCs safety, sequence, safe use of RW.    Stairs Stairs: Yes Min Assist Stair Management: One rail Right(1 HHA on L) Number of Stairs: 2(x2) (up and overt portable steps x 2) General stair comments: VCs safety, technique, sequence. Pt used rail on R to  simulate post. Instructed pt to use wife's hand for assist on L side. Pt initially reluctant to let wife help. Informed pt that he needs to let wife help on L side or use a cane on L side, for safety reasons.  Assist needed to stabilize.   Wheelchair Mobility    Modified Rankin (Stroke Patients Only)       Balance Overall balance assessment: Needs assistance Sitting-balance support: No upper extremity supported;Feet supported Sitting balance-Leahy Scale: Good     Standing balance support: No upper extremity supported;During functional activity Standing balance-Leahy Scale: Fair                              Cognition Arousal/Alertness: Awake/alert Behavior During Therapy: WFL for tasks assessed/performed Overall Cognitive Status: Within Functional Limits for tasks assessed                                        Exercises Total Joint Exercises Ankle Circles/Pumps: AROM;Both;10 reps;Seated Quad Sets: AROM;Both;10 reps;Seated Hip ABduction/ADduction: AROM;Right;10 reps;Seated Straight Leg Raises: AROM;Right;10 reps;Supine Knee Flexion: AROM;Right;10 reps;Seated Goniometric ROM: ~10-80 degrees    General Comments        Pertinent Vitals/Pain Pain Assessment: 0-10 Pain Score: 6  Pain Location: R knee/thigh Pain Descriptors / Indicators: Sore;Aching Pain Intervention(s): Monitored during session;Premedicated before session;Ice applied    Home Living Family/patient expects to be discharged to:: Private residence Living Arrangements: Spouse/significant other Available Help at  Discharge: Family Type of Home: House Home Access: Stairs to enter Entrance Stairs-Rails: None Home Layout: Multi-level Home Equipment: Crutches;Cane - single point      Prior Function Level of Independence: Independent          PT Goals (current goals can now be found in the care plan section) Acute Rehab PT Goals Patient Stated Goal: regain PLOF. Get back to  working out at the gym  PT Goal Formulation: With patient Time For Goal Achievement: 07/19/17 Potential to Achieve Goals: Good Progress towards PT goals: Progressing toward goals    Frequency    7X/week      PT Plan Current plan remains appropriate    Co-evaluation              AM-PAC PT "6 Clicks" Daily Activity  Outcome Measure  Difficulty turning over in bed (including adjusting bedclothes, sheets and blankets)?: A Little Difficulty moving from lying on back to sitting on the side of the bed? : A Little Difficulty sitting down on and standing up from a chair with arms (e.g., wheelchair, bedside commode, etc,.)?: A Little Help needed moving to and from a bed to chair (including a wheelchair)?: A Little Help needed walking in hospital room?: A Little Help needed climbing 3-5 steps with a railing? : A Little 6 Click Score: 18    End of Session Equipment Utilized During Treatment: Gait belt Activity Tolerance: Patient tolerated treatment well Patient left: in chair;with call bell/phone within reach;with family/visitor present   PT Visit Diagnosis: Muscle weakness (generalized) (M62.81);Difficulty in walking, not elsewhere classified (R26.2)     Time: 1316-1340 PT Time Calculation (min) (ACUTE ONLY): 24 min  Charges:  $Gait Training: 23-37 mins                    G Codes:          Weston Anna, MPT Pager: [phone]

## 2017-07-05 NOTE — Discharge Summary (Signed)
Physician Discharge Summary   Patient ID: Jordan Lopez MRN: [id number] DOB/AGE: 1961/04/06 56 y.o.  Admit date: 07/04/2017 Discharge date: 07-05-2017  Primary Diagnosis:  Osteoarthritis  Right knee(s)   Admission Diagnoses:  Past Medical History:  Diagnosis Date  . Arthritis   . Complication of anesthesia   . PONV (postoperative nausea and vomiting)    Discharge Diagnoses:   Principal Problem:   OA (osteoarthritis) of knee  Estimated body mass index is 36.8 kg/m as calculated from the following:   Height as of this encounter: '5\' 8"'$  (1.727 m).   Weight as of this encounter: 109.8 kg (242 lb).  Procedure:  Procedure(s) (LRB): RIGHT TOTAL KNEE ARTHROPLASTY (Right)   Consults: None  HPI: Jordan Lopez is a 56 y.o. year old male with end stage OA of his right knee with progressively worsening pain and dysfunction. He has constant pain, with activity and at rest and significant functional deficits with difficulties even with ADLs. He has had extensive non-op management including analgesics, injections of cortisone and viscosupplements, and home exercise program, but remains in significant pain with significant dysfunction. Radiographs show bone on bone arthritis medial and patellofemoral. He presents now for right Total Knee Arthroplasty.     Laboratory Data: Admission on 07/04/2017  Component Date Value Ref Range Status  . aPTT 07/04/2017 30  24 - 36 seconds Final  . WBC 07/04/2017 8.9  4.0 - 10.5 K/uL Final  . RBC 07/04/2017 4.78  4.22 - 5.81 MIL/uL Final  . Hemoglobin 07/04/2017 14.2  13.0 - 17.0 g/dL Final  . HCT 07/04/2017 42.1  39.0 - 52.0 % Final  . MCV 07/04/2017 88.1  78.0 - 100.0 fL Final  . MCH 07/04/2017 29.7  26.0 - 34.0 pg Final  . MCHC 07/04/2017 33.7  30.0 - 36.0 g/dL Final  . RDW 07/04/2017 12.8  11.5 - 15.5 % Final  . Platelets 07/04/2017 146* 150 - 400 K/uL Final  . Sodium 07/04/2017 138  135 - 145 mmol/L Final  . Potassium 07/04/2017 4.9  3.5 - 5.1 mmol/L  Final  . Chloride 07/04/2017 103  101 - 111 mmol/L Final  . CO2 07/04/2017 28  22 - 32 mmol/L Final  . Glucose, Bld 07/04/2017 144* 65 - 99 mg/dL Final  . BUN 07/04/2017 17  6 - 20 mg/dL Final  . Creatinine, Ser 07/04/2017 1.01  0.61 - 1.24 mg/dL Final  . Calcium 07/04/2017 9.0  8.9 - 10.3 mg/dL Final  . Total Protein 07/04/2017 7.0  6.5 - 8.1 g/dL Final  . Albumin 07/04/2017 4.0  3.5 - 5.0 g/dL Final  . AST 07/04/2017 18  15 - 41 U/L Final  . ALT 07/04/2017 19  17 - 63 U/L Final  . Alkaline Phosphatase 07/04/2017 64  38 - 126 U/L Final  . Total Bilirubin 07/04/2017 0.9  0.3 - 1.2 mg/dL Final  . GFR calc non Af Amer 07/04/2017 >60  >60 mL/min Final  . GFR calc Af Amer 07/04/2017 >60  >60 mL/min Final   Comment: (NOTE) The eGFR has been calculated using the CKD EPI equation. This calculation has not been validated in all clinical situations. eGFR's persistently <60 mL/min signify possible Chronic Kidney Disease.   . Anion gap 07/04/2017 7  5 - 15 Final  . Prothrombin Time 07/04/2017 13.3  11.4 - 15.2 seconds Final  . INR 07/04/2017 1.01   Final  . WBC 07/05/2017 14.3* 4.0 - 10.5 K/uL Final  . RBC 07/05/2017 4.76  4.22 - 5.81  MIL/uL Final  . Hemoglobin 07/05/2017 14.0  13.0 - 17.0 g/dL Final  . HCT 07/05/2017 41.3  39.0 - 52.0 % Final  . MCV 07/05/2017 86.8  78.0 - 100.0 fL Final  . MCH 07/05/2017 29.4  26.0 - 34.0 pg Final  . MCHC 07/05/2017 33.9  30.0 - 36.0 g/dL Final  . RDW 07/05/2017 12.8  11.5 - 15.5 % Final  . Platelets 07/05/2017 163  150 - 400 K/uL Final  . Sodium 07/05/2017 139  135 - 145 mmol/L Final  . Potassium 07/05/2017 4.0  3.5 - 5.1 mmol/L Final   Comment: DELTA CHECK NOTED REPEATED TO VERIFY   . Chloride 07/05/2017 106  101 - 111 mmol/L Final  . CO2 07/05/2017 26  22 - 32 mmol/L Final  . Glucose, Bld 07/05/2017 123* 65 - 99 mg/dL Final  . BUN 07/05/2017 13  6 - 20 mg/dL Final  . Creatinine, Ser 07/05/2017 0.93  0.61 - 1.24 mg/dL Final  . Calcium 07/05/2017  8.9  8.9 - 10.3 mg/dL Final  . GFR calc non Af Amer 07/05/2017 >60  >60 mL/min Final  . GFR calc Af Amer 07/05/2017 >60  >60 mL/min Final   Comment: (NOTE) The eGFR has been calculated using the CKD EPI equation. This calculation has not been validated in all clinical situations. eGFR's persistently <60 mL/min signify possible Chronic Kidney Disease.   Georgiann Hahn gap 07/05/2017 7  5 - 15 Final  Hospital Outpatient Visit on 06/27/2017  Component Date Value Ref Range Status  . aPTT 06/27/2017 29  24 - 36 seconds Final  . WBC 06/27/2017 10.8* 4.0 - 10.5 K/uL Final  . RBC 06/27/2017 5.31  4.22 - 5.81 MIL/uL Final  . Hemoglobin 06/27/2017 16.1  13.0 - 17.0 g/dL Final  . HCT 06/27/2017 46.0  39.0 - 52.0 % Final  . MCV 06/27/2017 86.6  78.0 - 100.0 fL Final  . MCH 06/27/2017 30.3  26.0 - 34.0 pg Final  . MCHC 06/27/2017 35.0  30.0 - 36.0 g/dL Final  . RDW 06/27/2017 13.1  11.5 - 15.5 % Final  . Platelets 06/27/2017 152  150 - 400 K/uL Final  . Sodium 06/27/2017 139  135 - 145 mmol/L Final  . Potassium 06/27/2017 4.5  3.5 - 5.1 mmol/L Final  . Chloride 06/27/2017 102  101 - 111 mmol/L Final  . CO2 06/27/2017 26  22 - 32 mmol/L Final  . Glucose, Bld 06/27/2017 114* 65 - 99 mg/dL Final  . BUN 06/27/2017 15  6 - 20 mg/dL Final  . Creatinine, Ser 06/27/2017 1.22  0.61 - 1.24 mg/dL Final  . Calcium 06/27/2017 9.4  8.9 - 10.3 mg/dL Final  . Total Protein 06/27/2017 7.4  6.5 - 8.1 g/dL Final  . Albumin 06/27/2017 3.9  3.5 - 5.0 g/dL Final  . AST 06/27/2017 21  15 - 41 U/L Final  . ALT 06/27/2017 19  17 - 63 U/L Final  . Alkaline Phosphatase 06/27/2017 71  38 - 126 U/L Final  . Total Bilirubin 06/27/2017 0.6  0.3 - 1.2 mg/dL Final  . GFR calc non Af Amer 06/27/2017 >60  >60 mL/min Final  . GFR calc Af Amer 06/27/2017 >60  >60 mL/min Final   Comment: (NOTE) The eGFR has been calculated using the CKD EPI equation. This calculation has not been validated in all clinical situations. eGFR's  persistently <60 mL/min signify possible Chronic Kidney Disease.   . Anion gap 06/27/2017 11  5 - 15 Final  .  Prothrombin Time 06/27/2017 13.4  11.4 - 15.2 seconds Final  . INR 06/27/2017 1.03   Final  . ABO/RH(D) 06/27/2017 O POS   Final  . Antibody Screen 06/27/2017 NEG   Final  . Sample Expiration 06/27/2017 07/07/2017   Final  . Extend sample reason 06/27/2017 NO TRANSFUSIONS OR PREGNANCY IN THE PAST 3 MONTHS   Final  . MRSA, PCR 06/27/2017 POSITIVE* NEGATIVE Final   Comment: RESULT CALLED TO, READ BACK BY AND VERIFIED WITH: PHILLIPS,C. RN AT 2297 06/28/17 MULLINS,T   . Staphylococcus aureus 06/27/2017 POSITIVE* NEGATIVE Final   Comment: RESULT CALLED TO, READ BACK BY AND VERIFIED WITH: PHILLIPS,C. RN AT 9892 06/28/17 MULLINS,T (NOTE) The Xpert SA Assay (FDA approved for NASAL specimens in patients 46 years of age and older), is one component of a comprehensive surveillance program. It is not intended to diagnose infection nor to guide or monitor treatment.   . ABO/RH(D) 06/27/2017 O POS   Final     X-Rays:No results found.  EKG:No orders found for this or any previous visit.   Hospital Course: Jordan Lopez is a 56 y.o. who was admitted to [facility]. They were brought to the operating room on 07/04/2017 and underwent Procedure(s): RIGHT TOTAL KNEE ARTHROPLASTY.  Patient tolerated the procedure well and was later transferred to the recovery room and then to the orthopaedic floor for postoperative care.  They were given PO and IV analgesics for pain control following their surgery.  They were given 24 hours of postoperative antibiotics of  Anti-infectives (From admission, onward)   Start     Dose/Rate Route Frequency Ordered Stop   07/04/17 1630  ceFAZolin (ANCEF) IVPB 2g/100 mL premix     2 g 200 mL/hr over 30 Minutes Intravenous Every 6 hours 07/04/17 1426 07/04/17 2316   07/04/17 0848  ceFAZolin (ANCEF) 2-4 GM/100ML-% IVPB    Comments:  [place] Session   : cabinet  override      07/04/17 0848 07/04/17 1020   07/04/17 0845  vancomycin (VANCOCIN) 1,500 mg in sodium chloride 0.9 % 500 mL IVPB     1,500 mg 250 mL/hr over 120 Minutes Intravenous  Once 07/04/17 0843 07/04/17 1158   07/04/17 0844  ceFAZolin (ANCEF) IVPB 2g/100 mL premix     2 g 200 mL/hr over 30 Minutes Intravenous On call to O.R. 07/04/17 1194 07/04/17 1035     and started on DVT prophylaxis in the form of Xarelto.   PT and OT were ordered for total joint protocol.  Discharge planning consulted to help with postop disposition and equipment needs.  Patient had a good night on the evening of surgery.  They started to get up OOB with therapy on day one. Hemovac drain was pulled without difficulty. Dressing was checked and was dry.  Patient was seen in rounds on POD 1, met goals with therapy and was ready to go home later that same day.  Diet - Regular diet Follow up - in 2 weeks Activity - WBAT Disposition - Home Condition Upon Discharge - stable D/C Meds - See DC Summary DVT Prophylaxis - Xarelto     Discharge Instructions    Call MD / Call 911   Complete by:  As directed    If you experience chest pain or shortness of breath, CALL 911 and be transported to the hospital emergency room.  If you develope a fever above 101 F, pus (white drainage) or increased drainage or redness at the wound, or calf pain,  call your surgeon's office.   Change dressing   Complete by:  As directed    Change dressing daily with sterile 4 x 4 inch gauze dressing and apply TED hose. Do not submerge the incision under water.   Constipation Prevention   Complete by:  As directed    Drink plenty of fluids.  Prune juice may be helpful.  You may use a stool softener, such as Colace (over the counter) 100 mg twice a day.  Use MiraLax (over the counter) for constipation as needed.   Diet general   Complete by:  As directed    Discharge instructions   Complete by:  As directed    Take Xarelto for two and a half  more weeks, then discontinue Xarelto. Once the patient has completed the blood thinner regimen, then take a Baby 81 mg Aspirin daily for three more weeks.   Pick up stool softner and laxative for home use following surgery while on pain medications. Do not submerge incision under water. Please use good hand washing techniques while changing dressing each day. May shower starting three days after surgery. Please use a clean towel to pat the incision dry following showers. Continue to use ice for pain and swelling after surgery. Do not use any lotions or creams on the incision until instructed by your surgeon.  Wear both TED hose on both legs during the day every day for three weeks, but may remove the TED hose at night at home.  Postoperative Constipation Protocol  Constipation - defined medically as fewer than three stools per week and severe constipation as less than one stool per week.  One of the most common issues patients have following surgery is constipation.  Even if you have a regular bowel pattern at home, your normal regimen is likely to be disrupted due to multiple reasons following surgery.  Combination of anesthesia, postoperative narcotics, change in appetite and fluid intake all can affect your bowels.  In order to avoid complications following surgery, here are some recommendations in order to help you during your recovery period.  Colace (docusate) - Pick up an over-the-counter form of Colace or another stool softener and take twice a day as long as you are requiring postoperative pain medications.  Take with a full glass of water daily.  If you experience loose stools or diarrhea, hold the colace until you stool forms back up.  If your symptoms do not get better within 1 week or if they get worse, check with your doctor.  Dulcolax (bisacodyl) - Pick up over-the-counter and take as directed by the product packaging as needed to assist with the movement of your bowels.  Take  with a full glass of water.  Use this product as needed if not relieved by Colace only.   MiraLax (polyethylene glycol) - Pick up over-the-counter to have on hand.  MiraLax is a solution that will increase the amount of water in your bowels to assist with bowel movements.  Take as directed and can mix with a glass of water, juice, soda, coffee, or tea.  Take if you go more than two days without a movement. Do not use MiraLax more than once per day. Call your doctor if you are still constipated or irregular after using this medication for 7 days in a row.  If you continue to have problems with postoperative constipation, please contact the office for further assistance and recommendations.  If you experience "the worst abdominal pain ever" or develop  nausea or vomiting, please contact the office immediatly for further recommendations for treatment.   Do not put a pillow under the knee. Place it under the heel.   Complete by:  As directed    Do not sit on low chairs, stoools or toilet seats, as it may be difficult to get up from low surfaces   Complete by:  As directed    Driving restrictions   Complete by:  As directed    No driving until released by the physician.   Increase activity slowly as tolerated   Complete by:  As directed    Lifting restrictions   Complete by:  As directed    No lifting until released by the physician.   Patient may shower   Complete by:  As directed    You may shower without a dressing once there is no drainage.  Do not wash over the wound.  If drainage remains, do not shower until drainage stops.   TED hose   Complete by:  As directed    Use stockings (TED hose) for 3 weeks on both leg(s).  You may remove them at night for sleeping.   Weight bearing as tolerated   Complete by:  As directed    Laterality:  right   Extremity:  Lower     Allergies as of 07/05/2017      Reactions   Hydrocodone Nausea And Vomiting      Medication List    STOP taking these  medications   CALCIUM-MAGNESIUM-ZINC PO   glucosamine-chondroitin 500-400 MG tablet   multivitamin tablet   naproxen sodium 220 MG tablet Commonly known as:  ALEVE   OMEGA-3-6-9 PO   OVER THE COUNTER MEDICATION   ST JOHNS WORT PO   TURMERIC PO     TAKE these medications   cetirizine 10 MG tablet Commonly known as:  ZYRTEC Take 10 mg by mouth daily.   dextromethorphan-guaiFENesin 30-600 MG 12hr tablet Commonly known as:  MUCINEX DM Take 1 tablet by mouth daily.   finasteride 5 MG tablet Commonly known as:  PROSCAR Take 5 mg by mouth daily.   methocarbamol 500 MG tablet Commonly known as:  ROBAXIN Take 1 tablet (500 mg total) every 6 (six) hours as needed by mouth for muscle spasms.   oxyCODONE 5 MG immediate release tablet Commonly known as:  Oxy IR/ROXICODONE Take 1-2 tablets (5-10 mg total) every 4 (four) hours as needed by mouth for moderate pain or severe pain ((score 4 to 6)).   rivaroxaban 10 MG Tabs tablet Commonly known as:  XARELTO Take 1 tablet (10 mg total) daily with breakfast by mouth. Take Xarelto for two and a half more weeks following discharge from the hospital, then discontinue Xarelto. Once the patient has completed the blood thinner regimen, then take a Baby 81 mg Aspirin daily for three more weeks.   tamsulosin 0.4 MG Caps capsule Commonly known as:  FLOMAX Take 0.4 mg by mouth daily.   traMADol 50 MG tablet Commonly known as:  ULTRAM Take 1-2 tablets (50-100 mg total) every 6 (six) hours as needed by mouth for moderate pain.            Discharge Care Instructions  (From admission, onward)        Start     Ordered   07/05/17 0000  Weight bearing as tolerated    Question Answer Comment  Laterality right   Extremity Lower      07/05/17 [phone]  07/05/17 0000  Change dressing    Comments:  Change dressing daily with sterile 4 x 4 inch gauze dressing and apply TED hose. Do not submerge the incision under water.   07/05/17 3612       Follow-up Information    Gaynelle Arabian, MD. Schedule an appointment as soon as possible for a visit on 07/19/2017.   Specialty:  Orthopedic Surgery Contact information: [address] [place] [address] [phone]           Signed: Arlee Muslim, PA-C Orthopaedic Surgery 07/05/2017, 8:28 AM

## 2017-07-05 NOTE — Progress Notes (Signed)
   Subjective: 1 Day Post-Op Procedure(s) (LRB): RIGHT TOTAL KNEE ARTHROPLASTY (Right) Patient reports pain as mild.   Patient seen in rounds for Dr. Wynelle Link. Patient is well, but has had some minor complaints of pain in the knee, requiring pain medications We will start therapy today.  If they do well with therapy and meets all goals, then will allow home later this afternoon following therapy. Plan is to go Home after hospital stay.  Objective: Vital signs in last 24 hours: Temp:  [95.7 F (35.4 C)-98.5 F (36.9 C)] 98.5 F (36.9 C) (11/06 0224) Pulse Rate:  [47-76] 56 (11/06 0531) Resp:  [4-20] 14 (11/06 0531) BP: (78-116)/(52-85) 99/63 (11/06 0531) SpO2:  [93 %-100 %] 97 % (11/06 0531) Weight:  [109.8 kg (242 lb)] 109.8 kg (242 lb) (11/05 0933)  Intake/Output from previous day:  Intake/Output Summary (Last 24 hours) at 07/05/2017 0806 Last data filed at 07/05/2017 0224 Gross per 24 hour  Intake 3538.33 ml  Output 3785 ml  Net -246.67 ml    Intake/Output this shift: No intake/output data recorded.  Labs: Recent Labs    07/04/17 1436 07/05/17 0614  HGB 14.2 14.0   Recent Labs    07/04/17 1436 07/05/17 0614  WBC 8.9 14.3*  RBC 4.78 4.76  HCT 42.1 41.3  PLT 146* 163   Recent Labs    07/04/17 1436 07/05/17 0614  NA 138 139  K 4.9 4.0  CL 103 106  CO2 28 26  BUN 17 13  CREATININE 1.01 0.93  GLUCOSE 144* 123*  CALCIUM 9.0 8.9   Recent Labs    07/04/17 1436  INR 1.01    EXAM General - Patient is Alert, Appropriate and Oriented Extremity - Neurovascular intact Sensation intact distally Intact pulses distally Dorsiflexion/Plantar flexion intact Dressing - dressing C/D/I Motor Function - intact, moving foot and toes well on exam.  Hemovac pulled without difficulty.  Past Medical History:  Diagnosis Date  . Arthritis   . Complication of anesthesia   . PONV (postoperative nausea and vomiting)     Assessment/Plan: 1 Day Post-Op Procedure(s)  (LRB): RIGHT TOTAL KNEE ARTHROPLASTY (Right) Principal Problem:   OA (osteoarthritis) of knee  Estimated body mass index is 36.8 kg/m as calculated from the following:   Height as of this encounter: 5\' 8"  (1.727 m).   Weight as of this encounter: 109.8 kg (242 lb). Up with therapy Discharge home - straight to outpatient therapy  DVT Prophylaxis - Xarelto Weight-Bearing as tolerated to right leg D/C O2 and Pulse OX and try on Room Air  If meets goals and able to go home: Up with therapy Diet - Regular diet Follow up - in 2 weeks Activity - WBAT Disposition - Home Condition Upon Discharge - pending therapy D/C Meds - See DC Summary DVT Prophylaxis - [place] Grant, PA-C Orthopaedic Surgery

## 2017-07-05 NOTE — Progress Notes (Signed)
    Durable Medical Equipment  (From admission, onward)        Start     Ordered   07/05/17 1233  For home use only DME 3 n 1  Once     07/05/17 1232   07/05/17 1232  For home use only DME Walker rolling  Once    Question:  Patient needs a walker to treat with the following condition  Answer:  S/P knee surgery   07/05/17 1232    [phone]

## 2017-07-08 DIAGNOSIS — Z471 Aftercare following joint replacement surgery: Secondary | ICD-10-CM | POA: Diagnosis not present

## 2017-07-08 DIAGNOSIS — R2689 Other abnormalities of gait and mobility: Secondary | ICD-10-CM | POA: Diagnosis not present

## 2017-07-08 DIAGNOSIS — M25561 Pain in right knee: Secondary | ICD-10-CM | POA: Diagnosis not present

## 2017-07-11 DIAGNOSIS — R2689 Other abnormalities of gait and mobility: Secondary | ICD-10-CM | POA: Diagnosis not present

## 2017-07-11 DIAGNOSIS — Z471 Aftercare following joint replacement surgery: Secondary | ICD-10-CM | POA: Diagnosis not present

## 2017-07-11 DIAGNOSIS — M25561 Pain in right knee: Secondary | ICD-10-CM | POA: Diagnosis not present

## 2017-07-13 DIAGNOSIS — R2689 Other abnormalities of gait and mobility: Secondary | ICD-10-CM | POA: Diagnosis not present

## 2017-07-13 DIAGNOSIS — Z471 Aftercare following joint replacement surgery: Secondary | ICD-10-CM | POA: Diagnosis not present

## 2017-07-13 DIAGNOSIS — M25561 Pain in right knee: Secondary | ICD-10-CM | POA: Diagnosis not present

## 2017-07-15 DIAGNOSIS — R2689 Other abnormalities of gait and mobility: Secondary | ICD-10-CM | POA: Diagnosis not present

## 2017-07-15 DIAGNOSIS — M25561 Pain in right knee: Secondary | ICD-10-CM | POA: Diagnosis not present

## 2017-07-15 DIAGNOSIS — Z471 Aftercare following joint replacement surgery: Secondary | ICD-10-CM | POA: Diagnosis not present

## 2017-07-18 DIAGNOSIS — Z471 Aftercare following joint replacement surgery: Secondary | ICD-10-CM | POA: Diagnosis not present

## 2017-07-18 DIAGNOSIS — R2689 Other abnormalities of gait and mobility: Secondary | ICD-10-CM | POA: Diagnosis not present

## 2017-07-18 DIAGNOSIS — M25561 Pain in right knee: Secondary | ICD-10-CM | POA: Diagnosis not present

## 2017-07-20 DIAGNOSIS — R2689 Other abnormalities of gait and mobility: Secondary | ICD-10-CM | POA: Diagnosis not present

## 2017-07-20 DIAGNOSIS — Z471 Aftercare following joint replacement surgery: Secondary | ICD-10-CM | POA: Diagnosis not present

## 2017-07-20 DIAGNOSIS — M25561 Pain in right knee: Secondary | ICD-10-CM | POA: Diagnosis not present

## 2017-07-22 DIAGNOSIS — M25561 Pain in right knee: Secondary | ICD-10-CM | POA: Diagnosis not present

## 2017-07-22 DIAGNOSIS — Z471 Aftercare following joint replacement surgery: Secondary | ICD-10-CM | POA: Diagnosis not present

## 2017-07-22 DIAGNOSIS — R2689 Other abnormalities of gait and mobility: Secondary | ICD-10-CM | POA: Diagnosis not present

## 2017-07-25 DIAGNOSIS — Z471 Aftercare following joint replacement surgery: Secondary | ICD-10-CM | POA: Diagnosis not present

## 2017-07-25 DIAGNOSIS — R2689 Other abnormalities of gait and mobility: Secondary | ICD-10-CM | POA: Diagnosis not present

## 2017-07-25 DIAGNOSIS — M25561 Pain in right knee: Secondary | ICD-10-CM | POA: Diagnosis not present

## 2017-07-27 DIAGNOSIS — R2689 Other abnormalities of gait and mobility: Secondary | ICD-10-CM | POA: Diagnosis not present

## 2017-07-27 DIAGNOSIS — Z471 Aftercare following joint replacement surgery: Secondary | ICD-10-CM | POA: Diagnosis not present

## 2017-07-27 DIAGNOSIS — M25561 Pain in right knee: Secondary | ICD-10-CM | POA: Diagnosis not present

## 2017-07-29 DIAGNOSIS — M25561 Pain in right knee: Secondary | ICD-10-CM | POA: Diagnosis not present

## 2017-07-29 DIAGNOSIS — R2689 Other abnormalities of gait and mobility: Secondary | ICD-10-CM | POA: Diagnosis not present

## 2017-07-29 DIAGNOSIS — Z471 Aftercare following joint replacement surgery: Secondary | ICD-10-CM | POA: Diagnosis not present

## 2017-08-01 DIAGNOSIS — M25561 Pain in right knee: Secondary | ICD-10-CM | POA: Diagnosis not present

## 2017-08-01 DIAGNOSIS — Z471 Aftercare following joint replacement surgery: Secondary | ICD-10-CM | POA: Diagnosis not present

## 2017-08-01 DIAGNOSIS — R2689 Other abnormalities of gait and mobility: Secondary | ICD-10-CM | POA: Diagnosis not present

## 2017-08-03 DIAGNOSIS — Z471 Aftercare following joint replacement surgery: Secondary | ICD-10-CM | POA: Diagnosis not present

## 2017-08-03 DIAGNOSIS — M25561 Pain in right knee: Secondary | ICD-10-CM | POA: Diagnosis not present

## 2017-08-03 DIAGNOSIS — R2689 Other abnormalities of gait and mobility: Secondary | ICD-10-CM | POA: Diagnosis not present

## 2017-08-05 DIAGNOSIS — M25561 Pain in right knee: Secondary | ICD-10-CM | POA: Diagnosis not present

## 2017-08-05 DIAGNOSIS — Z471 Aftercare following joint replacement surgery: Secondary | ICD-10-CM | POA: Diagnosis not present

## 2017-08-05 DIAGNOSIS — R2689 Other abnormalities of gait and mobility: Secondary | ICD-10-CM | POA: Diagnosis not present

## 2017-08-09 DIAGNOSIS — M1711 Unilateral primary osteoarthritis, right knee: Secondary | ICD-10-CM | POA: Diagnosis not present

## 2017-08-16 DIAGNOSIS — Z96651 Presence of right artificial knee joint: Secondary | ICD-10-CM | POA: Diagnosis not present

## 2017-08-16 DIAGNOSIS — Z471 Aftercare following joint replacement surgery: Secondary | ICD-10-CM | POA: Diagnosis not present

## 2017-09-12 DIAGNOSIS — R7301 Impaired fasting glucose: Secondary | ICD-10-CM | POA: Diagnosis not present

## 2017-09-12 DIAGNOSIS — Z0000 Encounter for general adult medical examination without abnormal findings: Secondary | ICD-10-CM | POA: Diagnosis not present

## 2017-09-12 DIAGNOSIS — N401 Enlarged prostate with lower urinary tract symptoms: Secondary | ICD-10-CM | POA: Diagnosis not present

## 2017-09-14 DIAGNOSIS — Z23 Encounter for immunization: Secondary | ICD-10-CM | POA: Diagnosis not present

## 2017-09-14 DIAGNOSIS — N401 Enlarged prostate with lower urinary tract symptoms: Secondary | ICD-10-CM | POA: Diagnosis not present

## 2017-09-14 DIAGNOSIS — Z0001 Encounter for general adult medical examination with abnormal findings: Secondary | ICD-10-CM | POA: Diagnosis not present

## 2017-09-14 DIAGNOSIS — M25561 Pain in right knee: Secondary | ICD-10-CM | POA: Diagnosis not present

## 2018-02-02 DIAGNOSIS — L918 Other hypertrophic disorders of the skin: Secondary | ICD-10-CM | POA: Diagnosis not present

## 2018-02-02 DIAGNOSIS — J209 Acute bronchitis, unspecified: Secondary | ICD-10-CM | POA: Diagnosis not present

## 2018-02-02 DIAGNOSIS — J019 Acute sinusitis, unspecified: Secondary | ICD-10-CM | POA: Diagnosis not present

## 2018-02-08 DIAGNOSIS — R05 Cough: Secondary | ICD-10-CM | POA: Diagnosis not present

## 2018-02-08 DIAGNOSIS — J9811 Atelectasis: Secondary | ICD-10-CM | POA: Diagnosis not present

## 2018-02-20 DIAGNOSIS — J329 Chronic sinusitis, unspecified: Secondary | ICD-10-CM | POA: Diagnosis not present

## 2018-02-20 DIAGNOSIS — R05 Cough: Secondary | ICD-10-CM | POA: Diagnosis not present

## 2018-02-24 ENCOUNTER — Other Ambulatory Visit (HOSPITAL_COMMUNITY): Payer: Self-pay | Admitting: Pulmonary Disease

## 2018-02-24 DIAGNOSIS — J329 Chronic sinusitis, unspecified: Secondary | ICD-10-CM

## 2018-02-25 DIAGNOSIS — M79672 Pain in left foot: Secondary | ICD-10-CM | POA: Diagnosis not present

## 2018-02-25 DIAGNOSIS — M7732 Calcaneal spur, left foot: Secondary | ICD-10-CM | POA: Diagnosis not present

## 2018-03-15 ENCOUNTER — Ambulatory Visit (HOSPITAL_COMMUNITY)
Admission: RE | Admit: 2018-03-15 | Discharge: 2018-03-15 | Disposition: A | Discharge status: Home / Self Care | Payer: 59 | Source: Ambulatory Visit | Attending: Pulmonary Disease | Admitting: Pulmonary Disease

## 2018-03-15 DIAGNOSIS — J324 Chronic pansinusitis: Secondary | ICD-10-CM | POA: Diagnosis not present

## 2018-03-15 DIAGNOSIS — J329 Chronic sinusitis, unspecified: Secondary | ICD-10-CM | POA: Diagnosis present

## 2018-03-15 DIAGNOSIS — J0140 Acute pansinusitis, unspecified: Secondary | ICD-10-CM | POA: Diagnosis not present

## 2018-03-15 DIAGNOSIS — J342 Deviated nasal septum: Secondary | ICD-10-CM | POA: Insufficient documentation

## 2018-03-15 DIAGNOSIS — R05 Cough: Secondary | ICD-10-CM | POA: Diagnosis not present

## 2018-03-17 DIAGNOSIS — J329 Chronic sinusitis, unspecified: Secondary | ICD-10-CM | POA: Diagnosis not present

## 2018-04-20 ENCOUNTER — Ambulatory Visit (INDEPENDENT_AMBULATORY_CARE_PROVIDER_SITE_OTHER): Payer: 59 | Admitting: Otolaryngology

## 2018-04-20 DIAGNOSIS — J320 Chronic maxillary sinusitis: Secondary | ICD-10-CM | POA: Diagnosis not present

## 2018-04-20 DIAGNOSIS — J342 Deviated nasal septum: Secondary | ICD-10-CM

## 2018-04-20 DIAGNOSIS — J0101 Acute recurrent maxillary sinusitis: Secondary | ICD-10-CM

## 2018-04-21 ENCOUNTER — Other Ambulatory Visit (INDEPENDENT_AMBULATORY_CARE_PROVIDER_SITE_OTHER): Payer: Self-pay | Admitting: Otolaryngology

## 2018-04-21 DIAGNOSIS — J320 Chronic maxillary sinusitis: Secondary | ICD-10-CM

## 2018-05-08 ENCOUNTER — Other Ambulatory Visit: Payer: Self-pay | Admitting: Otolaryngology

## 2018-05-08 ENCOUNTER — Ambulatory Visit (HOSPITAL_COMMUNITY)
Admission: RE | Admit: 2018-05-08 | Discharge: 2018-05-08 | Disposition: A | Discharge status: Home / Self Care | Payer: 59 | Source: Ambulatory Visit | Attending: Otolaryngology | Admitting: Otolaryngology

## 2018-05-08 DIAGNOSIS — J320 Chronic maxillary sinusitis: Secondary | ICD-10-CM | POA: Insufficient documentation

## 2018-05-08 DIAGNOSIS — J3489 Other specified disorders of nose and nasal sinuses: Secondary | ICD-10-CM | POA: Diagnosis not present

## 2018-05-16 ENCOUNTER — Other Ambulatory Visit: Payer: Self-pay

## 2018-05-16 ENCOUNTER — Encounter (HOSPITAL_BASED_OUTPATIENT_CLINIC_OR_DEPARTMENT_OTHER): Payer: Self-pay | Admitting: *Deleted

## 2018-05-23 ENCOUNTER — Other Ambulatory Visit: Payer: Self-pay

## 2018-05-23 ENCOUNTER — Encounter (HOSPITAL_BASED_OUTPATIENT_CLINIC_OR_DEPARTMENT_OTHER)
Admission: RE | Disposition: A | Discharge status: Home / Self Care | Payer: Self-pay | Source: Ambulatory Visit | Attending: Otolaryngology

## 2018-05-23 ENCOUNTER — Ambulatory Visit (HOSPITAL_BASED_OUTPATIENT_CLINIC_OR_DEPARTMENT_OTHER): Payer: 59 | Admitting: Anesthesiology

## 2018-05-23 ENCOUNTER — Encounter (HOSPITAL_BASED_OUTPATIENT_CLINIC_OR_DEPARTMENT_OTHER): Payer: Self-pay | Admitting: *Deleted

## 2018-05-23 ENCOUNTER — Ambulatory Visit (HOSPITAL_BASED_OUTPATIENT_CLINIC_OR_DEPARTMENT_OTHER)
Admission: RE | Admit: 2018-05-23 | Discharge: 2018-05-23 | Disposition: A | Discharge status: Home / Self Care | Payer: 59 | Source: Ambulatory Visit | Attending: Otolaryngology | Admitting: Otolaryngology

## 2018-05-23 DIAGNOSIS — Z96651 Presence of right artificial knee joint: Secondary | ICD-10-CM | POA: Insufficient documentation

## 2018-05-23 DIAGNOSIS — J342 Deviated nasal septum: Secondary | ICD-10-CM | POA: Insufficient documentation

## 2018-05-23 DIAGNOSIS — J338 Other polyp of sinus: Secondary | ICD-10-CM | POA: Insufficient documentation

## 2018-05-23 DIAGNOSIS — J323 Chronic sphenoidal sinusitis: Secondary | ICD-10-CM

## 2018-05-23 DIAGNOSIS — J343 Hypertrophy of nasal turbinates: Secondary | ICD-10-CM | POA: Insufficient documentation

## 2018-05-23 DIAGNOSIS — J339 Nasal polyp, unspecified: Secondary | ICD-10-CM | POA: Diagnosis not present

## 2018-05-23 DIAGNOSIS — J3489 Other specified disorders of nose and nasal sinuses: Secondary | ICD-10-CM | POA: Insufficient documentation

## 2018-05-23 DIAGNOSIS — J322 Chronic ethmoidal sinusitis: Secondary | ICD-10-CM | POA: Diagnosis not present

## 2018-05-23 DIAGNOSIS — J324 Chronic pansinusitis: Secondary | ICD-10-CM | POA: Insufficient documentation

## 2018-05-23 DIAGNOSIS — J321 Chronic frontal sinusitis: Secondary | ICD-10-CM

## 2018-05-23 DIAGNOSIS — J320 Chronic maxillary sinusitis: Secondary | ICD-10-CM

## 2018-05-23 DIAGNOSIS — J329 Chronic sinusitis, unspecified: Secondary | ICD-10-CM | POA: Diagnosis not present

## 2018-05-23 HISTORY — PX: MAXILLARY ANTROSTOMY: SHX2003

## 2018-05-23 HISTORY — PX: FRONTAL SINUS EXPLORATION: SHX6591

## 2018-05-23 HISTORY — PX: NASAL SEPTOPLASTY W/ TURBINOPLASTY: SHX2070

## 2018-05-23 HISTORY — PX: SINUS ENDO WITH FUSION: SHX5329

## 2018-05-23 HISTORY — PX: ETHMOIDECTOMY: SHX5197

## 2018-05-23 SURGERY — SEPTOPLASTY, NOSE, WITH NASAL TURBINATE REDUCTION
Anesthesia: General | Site: Nose | Laterality: Bilateral

## 2018-05-23 MED ORDER — COCAINE HCL 4 % EX SOLN
CUTANEOUS | Status: AC
  Filled 2018-05-23: qty 4

## 2018-05-23 MED ORDER — FENTANYL CITRATE (PF) 100 MCG/2ML IJ SOLN
INTRAMUSCULAR | Status: AC
  Filled 2018-05-23: qty 2

## 2018-05-23 MED ORDER — OXYCODONE-ACETAMINOPHEN 5-325 MG PO TABS: 1.0000 | ORAL_TABLET | ORAL | 0 refills | Status: DC | PRN

## 2018-05-23 MED ORDER — DEXAMETHASONE SODIUM PHOSPHATE 4 MG/ML IJ SOLN
INTRAMUSCULAR | Status: DC | PRN
  Administered 2018-05-23: 10 mg via INTRAVENOUS

## 2018-05-23 MED ORDER — FENTANYL CITRATE (PF) 100 MCG/2ML IJ SOLN
25.0000 ug | INTRAMUSCULAR | Status: DC | PRN
  Administered 2018-05-23 (×2): 25 ug via INTRAVENOUS
  Administered 2018-05-23: 50 ug via INTRAVENOUS

## 2018-05-23 MED ORDER — SCOPOLAMINE 1 MG/3DAYS TD PT72
MEDICATED_PATCH | TRANSDERMAL | Status: AC
  Filled 2018-05-23: qty 1

## 2018-05-23 MED ORDER — BSS IO SOLN
INTRAOCULAR | Status: DC | PRN
  Administered 2018-05-23: 1

## 2018-05-23 MED ORDER — ONDANSETRON HCL 4 MG/2ML IJ SOLN
INTRAMUSCULAR | Status: AC
  Filled 2018-05-23: qty 2

## 2018-05-23 MED ORDER — EPHEDRINE SULFATE-NACL 50-0.9 MG/10ML-% IV SOSY
PREFILLED_SYRINGE | INTRAVENOUS | Status: DC | PRN
  Administered 2018-05-23: 10 mg via INTRAVENOUS

## 2018-05-23 MED ORDER — LIDOCAINE HCL (CARDIAC) PF 100 MG/5ML IV SOSY
PREFILLED_SYRINGE | INTRAVENOUS | Status: DC | PRN
  Administered 2018-05-23: 50 mg via INTRAVENOUS

## 2018-05-23 MED ORDER — FENTANYL CITRATE (PF) 100 MCG/2ML IJ SOLN
50.0000 ug | INTRAMUSCULAR | Status: AC | PRN
  Administered 2018-05-23: 50 ug via INTRAVENOUS
  Administered 2018-05-23: 25 ug via INTRAVENOUS
  Administered 2018-05-23: 100 ug via INTRAVENOUS
  Administered 2018-05-23 (×2): 50 ug via INTRAVENOUS
  Administered 2018-05-23: 25 ug via INTRAVENOUS

## 2018-05-23 MED ORDER — OXYMETAZOLINE HCL 0.05 % NA SOLN
NASAL | Status: DC | PRN
  Administered 2018-05-23: 1 via TOPICAL

## 2018-05-23 MED ORDER — LEVOFLOXACIN 500 MG PO TABS: 500.0000 mg | ORAL_TABLET | Freq: Every day | ORAL | 0 refills | Status: AC

## 2018-05-23 MED ORDER — SUGAMMADEX SODIUM 200 MG/2ML IV SOLN
INTRAVENOUS | Status: DC | PRN
  Administered 2018-05-23: 200 mg via INTRAVENOUS

## 2018-05-23 MED ORDER — ROCURONIUM BROMIDE 100 MG/10ML IV SOLN
INTRAVENOUS | Status: DC | PRN
  Administered 2018-05-23: 50 mg via INTRAVENOUS

## 2018-05-23 MED ORDER — SCOPOLAMINE 1 MG/3DAYS TD PT72
1.0000 | MEDICATED_PATCH | Freq: Once | TRANSDERMAL | Status: DC | PRN
  Administered 2018-05-23: 1.5 mg via TRANSDERMAL

## 2018-05-23 MED ORDER — LIDOCAINE 2% (20 MG/ML) 5 ML SYRINGE
INTRAMUSCULAR | Status: AC
  Filled 2018-05-23: qty 5

## 2018-05-23 MED ORDER — EPHEDRINE 5 MG/ML INJ
INTRAVENOUS | Status: AC
  Filled 2018-05-23: qty 10

## 2018-05-23 MED ORDER — DEXAMETHASONE SODIUM PHOSPHATE 10 MG/ML IJ SOLN
INTRAMUSCULAR | Status: AC
  Filled 2018-05-23: qty 1

## 2018-05-23 MED ORDER — MUPIROCIN 2 % EX OINT
TOPICAL_OINTMENT | CUTANEOUS | Status: DC | PRN
  Administered 2018-05-23: 1 via TOPICAL

## 2018-05-23 MED ORDER — CEFAZOLIN SODIUM-DEXTROSE 2-3 GM-%(50ML) IV SOLR
INTRAVENOUS | Status: DC | PRN
  Administered 2018-05-23: 2 g via INTRAVENOUS

## 2018-05-23 MED ORDER — BSS IO SOLN
INTRAOCULAR | Status: AC
  Filled 2018-05-23: qty 15

## 2018-05-23 MED ORDER — PROPOFOL 10 MG/ML IV BOLUS
INTRAVENOUS | Status: AC
  Filled 2018-05-23: qty 20

## 2018-05-23 MED ORDER — OXYCODONE HCL 5 MG PO TABS
5.0000 mg | ORAL_TABLET | Freq: Once | ORAL | Status: AC | PRN
  Administered 2018-05-23: 5 mg via ORAL

## 2018-05-23 MED ORDER — PROPOFOL 10 MG/ML IV BOLUS
INTRAVENOUS | Status: DC | PRN
  Administered 2018-05-23: 100 mg via INTRAVENOUS

## 2018-05-23 MED ORDER — MIDAZOLAM HCL 2 MG/2ML IJ SOLN
INTRAMUSCULAR | Status: AC
  Filled 2018-05-23: qty 2

## 2018-05-23 MED ORDER — LIDOCAINE-EPINEPHRINE 1 %-1:100000 IJ SOLN
INTRAMUSCULAR | Status: DC | PRN
  Administered 2018-05-23: 4 mL

## 2018-05-23 MED ORDER — OXYCODONE HCL 5 MG PO TABS
ORAL_TABLET | ORAL | Status: AC
  Filled 2018-05-23: qty 1

## 2018-05-23 MED ORDER — MIDAZOLAM HCL 2 MG/2ML IJ SOLN
1.0000 mg | INTRAMUSCULAR | Status: DC | PRN
  Administered 2018-05-23: 2 mg via INTRAVENOUS

## 2018-05-23 MED ORDER — COCAINE HCL 4 % EX SOLN
CUTANEOUS | Status: DC | PRN
  Administered 2018-05-23: 3 mL via TOPICAL

## 2018-05-23 MED ORDER — LACTATED RINGERS IV SOLN
INTRAVENOUS | Status: DC
  Administered 2018-05-23 (×3): via INTRAVENOUS

## 2018-05-23 MED ORDER — ONDANSETRON HCL 4 MG/2ML IJ SOLN
INTRAMUSCULAR | Status: DC | PRN
  Administered 2018-05-23: 4 mg via INTRAVENOUS

## 2018-05-23 MED ORDER — CEFAZOLIN SODIUM 1 G IJ SOLR
INTRAMUSCULAR | Status: AC
  Filled 2018-05-23: qty 20

## 2018-05-23 MED FILL — levoFLOXacin 500 MG TABS: 500 | 7 days supply | Qty: 7 | Fill #0

## 2018-05-23 MED FILL — OXYCODONE-ACETAMINOPHEN 5-3: 5-325 | 5 days supply | Qty: 20 | Fill #0

## 2018-05-23 SURGICAL SUPPLY — 53 items
BLADE RAD40 ROTATE 4M 4 5PK (BLADE) IMPLANT
BLADE RAD60 ROTATE M4 4 5PK (BLADE) IMPLANT
BLADE ROTATE RAD 12 4 M4 (BLADE) IMPLANT
BLADE ROTATE RAD 40 4 M4 (BLADE) IMPLANT
BLADE ROTATE TRICUT 4X13 M4 (BLADE) ×2 IMPLANT
BLADE TRICUT ROTATE M4 4 5PK (BLADE) IMPLANT
BUR HS RAD FRONTAL 3 (BURR) IMPLANT
CANISTER SUC SOCK COL 7IN (MISCELLANEOUS) ×4 IMPLANT
CANISTER SUCT 1200ML W/VALVE (MISCELLANEOUS) ×2 IMPLANT
COAGULATOR SUCT 8FR VV (MISCELLANEOUS) ×2 IMPLANT
COAGULATOR SUCT SWTCH 10FR 6 (ELECTROSURGICAL) IMPLANT
DECANTER SPIKE VIAL GLASS SM (MISCELLANEOUS) ×2 IMPLANT
DRSG NASAL KENNEDY LMNT 8CM (GAUZE/BANDAGES/DRESSINGS) IMPLANT
DRSG NASOPORE 8CM (GAUZE/BANDAGES/DRESSINGS) ×2 IMPLANT
DRSG TELFA 3X8 NADH (GAUZE/BANDAGES/DRESSINGS) IMPLANT
ELECT REM PT RETURN 9FT ADLT (ELECTROSURGICAL) ×2
ELECTRODE REM PT RTRN 9FT ADLT (ELECTROSURGICAL) ×1 IMPLANT
GLOVE BIO SURGEON STRL SZ7 (GLOVE) ×2 IMPLANT
GLOVE BIO SURGEON STRL SZ7.5 (GLOVE) ×2 IMPLANT
GOWN STRL REUS W/ TWL LRG LVL3 (GOWN DISPOSABLE) ×1 IMPLANT
GOWN STRL REUS W/ TWL XL LVL3 (GOWN DISPOSABLE) ×1 IMPLANT
GOWN STRL REUS W/TWL LRG LVL3 (GOWN DISPOSABLE) ×1
GOWN STRL REUS W/TWL XL LVL3 (GOWN DISPOSABLE) ×1
HEMOSTAT SURGICEL 2X14 (HEMOSTASIS) IMPLANT
IV NS 1000ML (IV SOLUTION)
IV NS 1000ML BAXH (IV SOLUTION) IMPLANT
IV NS 500ML (IV SOLUTION) ×1
IV NS 500ML BAXH (IV SOLUTION) ×1 IMPLANT
NEEDLE HYPO 25X1 1.5 SAFETY (NEEDLE) ×2 IMPLANT
NEEDLE PRECISIONGLIDE 27X1.5 (NEEDLE) IMPLANT
NEEDLE SPNL 25GX3.5 QUINCKE BL (NEEDLE) IMPLANT
NS IRRIG 1000ML POUR BTL (IV SOLUTION) ×2 IMPLANT
PACK BASIN DAY SURGERY FS (CUSTOM PROCEDURE TRAY) ×2 IMPLANT
PACK ENT DAY SURGERY (CUSTOM PROCEDURE TRAY) ×2 IMPLANT
PACKING NASAL EPIS 4X2.4 XEROG (MISCELLANEOUS) IMPLANT
SLEEVE SCD COMPRESS KNEE MED (MISCELLANEOUS) ×2 IMPLANT
SOLUTION BUTLER CLEAR DIP (MISCELLANEOUS) ×2 IMPLANT
SPLINT NASAL AIRWAY SILICONE (MISCELLANEOUS) ×2 IMPLANT
SPONGE GAUZE 2X2 8PLY STRL LF (GAUZE/BANDAGES/DRESSINGS) ×2 IMPLANT
SPONGE NEURO XRAY DETECT 1X3 (DISPOSABLE) ×2 IMPLANT
SUT CHROMIC 4 0 P 3 18 (SUTURE) ×2 IMPLANT
SUT PLAIN 4 0 ~~LOC~~ 1 (SUTURE) ×2 IMPLANT
SUT PROLENE 3 0 PS 2 (SUTURE) ×2 IMPLANT
SUT VIC AB 4-0 P-3 18XBRD (SUTURE) IMPLANT
SUT VIC AB 4-0 P3 18 (SUTURE)
SYR 50ML LL SCALE MARK (SYRINGE) ×2 IMPLANT
TOWEL GREEN STERILE FF (TOWEL DISPOSABLE) ×2 IMPLANT
TRACKER ENT INSTRUMENT (MISCELLANEOUS) ×2 IMPLANT
TRACKER ENT PATIENT (MISCELLANEOUS) ×2 IMPLANT
TUBE CONNECTING 20X1/4 (TUBING) ×2 IMPLANT
TUBE SALEM SUMP 16 FR W/ARV (TUBING) ×2 IMPLANT
TUBING STRAIGHTSHOT EPS 5PK (TUBING) ×2 IMPLANT
YANKAUER SUCT BULB TIP NO VENT (SUCTIONS) ×2 IMPLANT

## 2018-05-23 NOTE — H&P (Signed)
Cc: Chronic rhinosinusitis  HPI: The patient is a 57 y/o male who presents today with his wife for evaluation of chronic URI. The patient is seen in consultation requested by Dr. Sinda Du. The patient has noted frequent upper respiratory infections for the past 2 years. He has been treated with multiple antibiotics and oral steroids with minimal relief. The patient recently had a chest CT which was normal. A subsequent sinus CT showed paranasal sinus disease with septal deviation. The patient denies facial pain or pressure.  He just completed a 30 day course of Cefdinir with no improvement in his upper respiratory symptoms. No previous ENT surgery is noted.   The patient's review of systems (constitutional, eyes, ENT, cardiovascular, respiratory, GI, musculoskeletal, skin, neurologic, psychiatric, endocrine, hematologic, allergic) is noted in the ROS questionnaire.  It is reviewed with the patient and his wife.   Family health history: heart disease.   Major events: Right total knee replacement.   Ongoing medical problems: osteoarthritis, chronic cough.   Social history: The patient is married. He denies the use of tobacco or illegal drugs. He  drinks a 12 pack of beer per week.    Exam: General: Communicates without difficulty, well nourished, no acute distress. Head: Normocephalic, no evidence injury, no tenderness, facial buttresses intact without stepoff. Eyes: PERRL, EOMI. No scleral icterus, conjunctivae clear. Neuro: CN II exam reveals vision grossly intact.  No nystagmus at any point of gaze. Ears: Auricles well formed without lesions.  Ear canals are intact without mass or lesion.  No erythema or edema is appreciated.  The TMs are intact without fluid. Nose: External evaluation reveals normal support and skin without lesions.  Dorsum is intact.  Anterior rhinoscopy reveals congested and edematous mucosa over anterior aspect of the inferior turbinates and nasal septum.  No purulence is  noted. Middle meatus is not well visualized. Oral:  Oral cavity and oropharynx are intact, symmetric, without erythema or edema.  Mucosa is moist without lesions. Neck: Full range of motion without pain.  There is no significant lymphadenopathy.  No masses palpable.  Thyroid bed within normal limits to palpation.  Parotid glands and submandibular glands equal bilaterally without mass.  Trachea is midline. Neuro:  CN 2-12 grossly intact. Gait normal. Vestibular: No nystagmus at any point of gaze.   Procedure:  Flexible Nasal Endoscopy: Risks, benefits, and alternatives of flexible endoscopy were explained to the patient.  Specific mention was made of the risk of throat numbness with difficulty swallowing, possible bleeding from the nose and mouth, and pain from the procedure.  The patient gave oral consent to proceed.  The nasal cavities were decongested and anesthetised with a combination of oxymetazoline and 4% lidocaine solution.  The flexible scope was inserted into the right nasal cavity. Severe NSD. Endoscopy of the inferior and middle meatus was performed.  The edematous mucosa was as described above.  No polyp, mass, or lesion was appreciated. Mucopurulent drainage noted.  Olfactory cleft was clear.  Nasopharynx was clear.  Turbinates were hypertrophied but without mass.  Incomplete response to decongestion.  The procedure was repeated on the contralateral side with similar findings.  The patient tolerated the procedure well.  Instructions were given to avoid eating or drinking for 2 hours.   Assessment  1. The patient is noted to have a severe septal deviation, bilateral inferior turbinate hypertrophy, and mucopurulent drainage. No other suspicious mass or lesion is noted on today's nasal endoscopy. 2. Sinus CT shows severe pansinusitis.   plan  Print visit summary  1. The physical exam, nasal endoscopy, and CT findings are reviewed with the patient and his wife at length.  2. The patient would  benefit from septoplasty and bilateral inferior turbinate reduction with functional endoscopic sinus surgery. The risks, benefits, alternatives, and details of the procedure are reviewed with the patient. Questions are invited and answered. 3. The patient is interested in proceeding with the procedures.  We will schedule the procedure in accordance with the family schedule.

## 2018-05-23 NOTE — Transfer of Care (Signed)
Immediate Anesthesia Transfer of Care Note  Patient: Jordan Lopez  Procedure(s) Performed: ENDOSCOPIC BILATERAL NASAL SEPTOPLASTY WITH TURBINATE REDUCTION (Bilateral Nose) ENDOSCOPIC BILATERAL MAXILLARY ANTROSTOMY WITH TISSUE REMOVAL (Bilateral Nose) ENDOSCOPIC BILATERAL FRONTAL SINUS EXPLORATION (Bilateral Nose) BILATERAL TOTAL ETHMOIDECTOMY AND SPHENOIDECTOMY WITH TISSUE REMOVAL (Bilateral Nose) SINUS ENDOSCOPY WITH FUSION NAVIGATION (Bilateral Nose)  Patient Location: PACU  Anesthesia Type:General  Level of Consciousness: awake, sedated and patient cooperative  Airway & Oxygen Therapy: Patient Spontanous Breathing and aerosol face mask  Post-op Assessment: Report given to RN and Post -op Vital signs reviewed and stable  Post vital signs: Reviewed and stable  Last Vitals:  Vitals Value Taken Time  BP 141/87 05/23/2018 12:15 PM  Temp    Pulse 83 05/23/2018 12:16 PM  Resp 8 05/23/2018 12:16 PM  SpO2 98 % 05/23/2018 12:16 PM  Vitals shown include unvalidated device data.  Last Pain:  Vitals:   05/23/18 0810  TempSrc: Oral  PainSc: 0-No pain      Patients Stated Pain Goal: 3 (43/83/77 [id number])  Complications: No apparent anesthesia complications

## 2018-05-23 NOTE — Discharge Instructions (Addendum)

## 2018-05-23 NOTE — Op Note (Signed)
DATE OF PROCEDURE: 05/23/2018  OPERATIVE REPORT   SURGEON: Leta Baptist, MD   PREOPERATIVE DIAGNOSES:  1. Bilateral severe pansinusitis and polyposis. 2. Severe nasal septal deviation.  3. Bilateral inferior turbinate hypertrophy.  4. Chronic nasal obstruction.  POSTOPERATIVE DIAGNOSES:  1. Bilateral severe pansinusitis and polyposis. 2. Severe nasal septal deviation.  3. Bilateral inferior turbinate hypertrophy.  4. Chronic nasal obstruction.  PROCEDURE PERFORMED:  1. Bilateral endoscopic total ethmoidectomy and sphenoidotomy with polyp removal. 2. Bilateral endoscopic frontal sinusotomy with polyp removal. 3. Bilateral endoscopic maxillary antrostomy with polyp removal. 4. Septoplasty.  5. Bilateral partial inferior turbinate resection.  6. FUSION stereotactic image guidance.  ANESTHESIA: General endotracheal tube anesthesia.   COMPLICATIONS: None.   ESTIMATED BLOOD LOSS: 600 mL.   INDICATION FOR PROCEDURE: Jordan Lopez is a 57 y.o. male with a history of bilateral chronic rhinosinusitis and recurrent exacerbations for the past 2 years. The patient was previously treated with multiple courses of extended antibiotics, antihistamine, decongestant, steroid nasal spray, and systemic steroids. However, the patient continued to be symptomatic. The patient was also complaining of chronic nasal obstruction. On his CT scan, he was noted to have bilateral severe pansinusitis, with opacification of all his paranasal sinuses. He was also noted to have significant nasal septal deviation and bilateral inferior turbinate hypertrophy. Based on the above findings, the decision was made for the patient to undergo the above-stated procedures. The risks, benefits, alternatives, and details of the procedures were discussed with the patient. Questions were invited and answered. Informed consent was obtained.   DESCRIPTION OF PROCEDURE: The patient was taken to the operating room and placed supine on the  operating table. General endotracheal tube anesthesia was administered by the anesthesiologist. The patient was positioned, and prepped and draped in the standard fashion for nasal surgery. Pledgets soaked with Afrin were placed in both nasal cavities for decongestion. The pledgets were subsequently removed. The FUSION stereotactic image guidance marker was placed. The image guidance system was functional throughout the case.  The above mentioned severe septal deviation was again noted. 1% lidocaine with 1:100,000 epinephrine was injected onto the nasal septum bilaterally. A hemitransfixion incision was made on the left side. The mucosal flap was carefully elevated on the left side. A cartilaginous incision was made 1 cm superior to the caudal margin of the nasal septum. Mucosal flap was also elevated on the right side in the similar fashion. It should be noted that due to the severe septal deviation, the deviated portion of the cartilaginous and bony septum had to be removed in piecemeal fashion. Once the deviated portions were removed, a straight midline septum was achieved. The septum was then quilted with 4-0 plain gut sutures. The hemitransfixion incision was closed with interrupted 4-0 chromic sutures.   Attention was turned to the hypertrophied inferior turbinates. The inferior one half of both hypertrophied inferior turbinate was crossclamped with a Kelly clamp. The inferior one half of each inferior turbinate was then resected with a pair of cross cutting scissors. Hemostasis was achieved with a suction cautery device.   Attention was then focused on the paranasal sinuses. A 0 endoscope was used to evaluate both nasal cavities. Mucopurulent drainage was noted from both middle meatus. Attention was first focused on the left paranasal sinuses. The left middle turbinate was carefully medialized. A large amount of polypoid tissue was noted to obstruct the left middle meatus. The polypoid tissue was  removed using a combination of microdebrider and Blakesley forceps. The maxillary antrum was entered  and enlarged using a combination of backbiter and Tru-Cut forceps. Polypoid tissue was removed from the maxillary sinus. The bony partitions of the anterior and posterior ethmoid cavities were then removed using a combination of Tru-Cut forceps and microdebrider. Polypoid tissue was also removed from the ethmoid sinuses. The opening to the sphenoid sinus was then entered and enlarged. Polypoid tissue was removed from the sphenoid sinus. Attention was then focused on the left frontal sinus. The frontal recess was identified. The surrounding bony partitions were taken down, in order to enlarge the frontal drainage pathway. All 4 paranasal sinuses were copiously irrigated. The same procedures were repeated on the right side without exceptions. Similar findings were also noted on the right side. Hemostasis was achieved with nasopore packing.  The care of the patient was turned over to the anesthesiologist. The patient was awakened from anesthesia without difficulty. The patient was extubated and transferred to the recovery room in good condition.   OPERATIVE FINDINGS: Bilateral chronic pansinusitis with mucopurulent drainage and sinonasal polyps. Severe nasal septal deviation. Bilateral inferior turbinate hypertrophy.  SPECIMEN: Bilateral sinus contents.  FOLLOWUP CARE: The patient be discharged home once he is awake and alert. The patient will be placed on Percocet 1 tablet p.o. q.4 hours p.r.n. pain, and levofloxacin 500 mg by mouth daily for 7 days. The patient will follow up in my office in approximately 1 week.  Master Touchet Raynelle Bring, MD

## 2018-05-23 NOTE — Anesthesia Preprocedure Evaluation (Addendum)
Anesthesia Evaluation  Patient identified by MRN, date of birth, ID band Patient awake    Reviewed: Allergy & Precautions, NPO status , Patient's Chart, lab work & pertinent test results  History of Anesthesia Complications (+) PONV  Airway Mallampati: I  TM Distance: >3 FB Neck ROM: Full    Dental no notable dental hx. (+) Teeth Intact, Dental Advisory Given   Pulmonary neg pulmonary ROS,    Pulmonary exam normal breath sounds clear to auscultation       Cardiovascular negative cardio ROS Normal cardiovascular exam Rhythm:Regular Rate:Normal     Neuro/Psych negative neurological ROS  negative psych ROS   GI/Hepatic negative GI ROS, Neg liver ROS,   Endo/Other  negative endocrine ROS  Renal/GU negative Renal ROS  negative genitourinary   Musculoskeletal  (+) Arthritis ,   Abdominal   Peds  Hematology negative hematology ROS (+)   Anesthesia Other Findings Septal deviation, bilateral turbinate hypertrophy  Reproductive/Obstetrics                            Anesthesia Physical Anesthesia Plan  ASA: II  Anesthesia Plan: General   Post-op Pain Management:    Induction: Intravenous  PONV Risk Score and Plan: 3 and Dexamethasone, Ondansetron, Midazolam and Scopolamine patch - Pre-op  Airway Management Planned: Oral ETT  Additional Equipment:   Intra-op Plan:   Post-operative Plan: Extubation in OR  Informed Consent: I have reviewed the patients History and Physical, chart, labs and discussed the procedure including the risks, benefits and alternatives for the proposed anesthesia with the patient or authorized representative who has indicated his/her understanding and acceptance.   Dental advisory given  Plan Discussed with: CRNA  Anesthesia Plan Comments:         Anesthesia Quick Evaluation

## 2018-05-23 NOTE — Anesthesia Procedure Notes (Signed)
Procedure Name: Intubation Date/Time: 05/23/2018 9:23 AM Performed by: Lyndee Leo, CRNA Pre-anesthesia Checklist: Patient identified, Emergency Drugs available, Suction available and Patient being monitored Patient Re-evaluated:Patient Re-evaluated prior to induction Oxygen Delivery Method: Circle system utilized Preoxygenation: Pre-oxygenation with 100% oxygen Induction Type: IV induction Ventilation: Mask ventilation without difficulty Laryngoscope Size: Miller and 3 Grade View: Grade II Tube type: Oral Rae Tube size: 8.0 mm Number of attempts: 2 Airway Equipment and Method: Stylet and Oral airway Placement Confirmation: ETT inserted through vocal cords under direct vision,  positive ETCO2 and breath sounds checked- equal and bilateral Secured at: 22 cm Tube secured with: Tape Dental Injury: Teeth and Oropharynx as per pre-operative assessment

## 2018-05-23 NOTE — Anesthesia Postprocedure Evaluation (Signed)
Anesthesia Post Note  Patient: Jordan Lopez  Procedure(s) Performed: ENDOSCOPIC BILATERAL NASAL SEPTOPLASTY WITH TURBINATE REDUCTION (Bilateral Nose) ENDOSCOPIC BILATERAL MAXILLARY ANTROSTOMY WITH TISSUE REMOVAL (Bilateral Nose) ENDOSCOPIC BILATERAL FRONTAL SINUS EXPLORATION (Bilateral Nose) BILATERAL TOTAL ETHMOIDECTOMY AND SPHENOIDECTOMY WITH TISSUE REMOVAL (Bilateral Nose) SINUS ENDOSCOPY WITH FUSION NAVIGATION (Bilateral Nose)     Patient location during evaluation: PACU Anesthesia Type: General Level of consciousness: awake and alert Pain management: pain level controlled Vital Signs Assessment: post-procedure vital signs reviewed and stable Respiratory status: spontaneous breathing, nonlabored ventilation, respiratory function stable and patient connected to nasal cannula oxygen Cardiovascular status: blood pressure returned to baseline and stable Postop Assessment: no apparent nausea or vomiting Anesthetic complications: no    Last Vitals:  Vitals:   05/23/18 1415 05/23/18 1430  BP: 138/89   Pulse: 77 90  Resp:    Temp:    SpO2: 94% 98%    Last Pain:  Vitals:   05/23/18 1430  TempSrc:   PainSc: 4                  Fuquan Wilson L Kolson Chovanec

## 2018-05-24 ENCOUNTER — Encounter (HOSPITAL_BASED_OUTPATIENT_CLINIC_OR_DEPARTMENT_OTHER): Payer: Self-pay | Admitting: Otolaryngology

## 2018-05-25 ENCOUNTER — Ambulatory Visit (INDEPENDENT_AMBULATORY_CARE_PROVIDER_SITE_OTHER): Payer: 59 | Admitting: Otolaryngology

## 2018-05-25 DIAGNOSIS — J320 Chronic maxillary sinusitis: Secondary | ICD-10-CM

## 2018-05-25 DIAGNOSIS — J321 Chronic frontal sinusitis: Secondary | ICD-10-CM

## 2018-05-25 DIAGNOSIS — J322 Chronic ethmoidal sinusitis: Secondary | ICD-10-CM | POA: Diagnosis not present

## 2018-05-25 DIAGNOSIS — J323 Chronic sphenoidal sinusitis: Secondary | ICD-10-CM

## 2018-06-08 ENCOUNTER — Ambulatory Visit (INDEPENDENT_AMBULATORY_CARE_PROVIDER_SITE_OTHER): Payer: 59 | Admitting: Otolaryngology

## 2018-06-08 DIAGNOSIS — J322 Chronic ethmoidal sinusitis: Secondary | ICD-10-CM

## 2018-06-08 DIAGNOSIS — J321 Chronic frontal sinusitis: Secondary | ICD-10-CM | POA: Diagnosis not present

## 2018-06-08 DIAGNOSIS — J323 Chronic sphenoidal sinusitis: Secondary | ICD-10-CM

## 2018-06-08 DIAGNOSIS — J320 Chronic maxillary sinusitis: Secondary | ICD-10-CM | POA: Diagnosis not present

## 2018-06-29 ENCOUNTER — Ambulatory Visit (INDEPENDENT_AMBULATORY_CARE_PROVIDER_SITE_OTHER): Payer: 59 | Admitting: Otolaryngology

## 2018-06-29 DIAGNOSIS — J322 Chronic ethmoidal sinusitis: Secondary | ICD-10-CM

## 2018-06-29 DIAGNOSIS — J321 Chronic frontal sinusitis: Secondary | ICD-10-CM

## 2018-06-29 DIAGNOSIS — J320 Chronic maxillary sinusitis: Secondary | ICD-10-CM

## 2018-06-29 DIAGNOSIS — J323 Chronic sphenoidal sinusitis: Secondary | ICD-10-CM | POA: Diagnosis not present

## 2018-09-15 DIAGNOSIS — Z0000 Encounter for general adult medical examination without abnormal findings: Secondary | ICD-10-CM | POA: Diagnosis not present

## 2018-09-20 DIAGNOSIS — Z6841 Body Mass Index (BMI) 40.0 and over, adult: Secondary | ICD-10-CM | POA: Diagnosis not present

## 2018-09-20 DIAGNOSIS — Z0001 Encounter for general adult medical examination with abnormal findings: Secondary | ICD-10-CM | POA: Diagnosis not present

## 2018-09-25 ENCOUNTER — Encounter: Payer: Self-pay | Admitting: Gastroenterology

## 2018-09-25 ENCOUNTER — Ambulatory Visit (INDEPENDENT_AMBULATORY_CARE_PROVIDER_SITE_OTHER): Payer: 59 | Admitting: Otolaryngology

## 2018-09-25 DIAGNOSIS — J310 Chronic rhinitis: Secondary | ICD-10-CM

## 2018-09-25 DIAGNOSIS — J320 Chronic maxillary sinusitis: Secondary | ICD-10-CM | POA: Diagnosis not present

## 2018-10-11 DIAGNOSIS — R509 Fever, unspecified: Secondary | ICD-10-CM | POA: Diagnosis not present

## 2018-10-11 DIAGNOSIS — R05 Cough: Secondary | ICD-10-CM | POA: Diagnosis not present

## 2018-10-11 DIAGNOSIS — J069 Acute upper respiratory infection, unspecified: Secondary | ICD-10-CM | POA: Diagnosis not present

## 2018-10-13 DIAGNOSIS — H109 Unspecified conjunctivitis: Secondary | ICD-10-CM | POA: Diagnosis not present

## 2018-10-13 DIAGNOSIS — Z6839 Body mass index (BMI) 39.0-39.9, adult: Secondary | ICD-10-CM | POA: Diagnosis not present

## 2018-10-13 DIAGNOSIS — J069 Acute upper respiratory infection, unspecified: Secondary | ICD-10-CM | POA: Diagnosis not present

## 2018-10-30 ENCOUNTER — Ambulatory Visit (INDEPENDENT_AMBULATORY_CARE_PROVIDER_SITE_OTHER): Payer: 59

## 2018-10-30 DIAGNOSIS — Z1211 Encounter for screening for malignant neoplasm of colon: Secondary | ICD-10-CM

## 2018-10-30 MED ORDER — NA SULFATE-K SULFATE-MG SULF 17.5-3.13-1.6 GM/177ML PO SOLN: 1.0000 | ORAL | 0 refills | Status: AC

## 2018-10-30 NOTE — Progress Notes (Addendum)
Gastroenterology Pre-Procedure Review  Request Date:10/30/18 Requesting Physician: Dr.Sasser- no previous tcs  PATIENT REVIEW QUESTIONS: The patient responded to the following health history questions as indicated:    1. Diabetes Melitis: no 2. Joint replacements in the past 12 months: no 3. Major health problems in the past 3 months: no 4. Has an artificial valve or MVP: no 5. Has a defibrillator: no 6. Has been advised in past to take antibiotics in advance of a procedure like teeth cleaning: no 7. Family history of colon cancer: yes (aunt (late 15"s))  83. Alcohol Use: yes ( occasional  Beer) 9. History of sleep apnea: no  10. History of coronary artery or other vascular stents placed within the last 12 months: no 11. History of any prior anesthesia complications: yes (nausea with surgery in 1976)    MEDICATIONS & ALLERGIES:    Patient reports the following regarding taking any blood thinners:   Plavix? no Aspirin? no Coumadin? no Brilinta? no Xarelto? no Eliquis? no Pradaxa? no Savaysa? no Effient? no  Patient confirms/reports the following medications:  Current Outpatient Medications  Medication Sig Dispense Refill  . dextromethorphan-guaiFENesin (MUCINEX DM) 30-600 MG 12hr tablet Take 1 tablet by mouth daily.    . finasteride (PROSCAR) 5 MG tablet Take 5 mg by mouth daily.    . fluticasone (FLONASE) 50 MCG/ACT nasal spray 2 SPRAYS INTO EACH NOSTRIL ONCE A DAY    . gabapentin (NEURONTIN) 300 MG capsule TK ONE C PO HS    . Multiple Vitamin (MULTIVITAMIN) capsule Take 1 capsule by mouth daily.    . Omega-3 Fatty Acids (FISH OIL) 1000 MG CAPS Take by mouth.     No current facility-administered medications for this visit.    Facility-Administered Medications Ordered in Other Visits  Medication Dose Route Frequency Provider Last Rate Last Dose  . vancomycin (VANCOCIN) 1,000 mg in sodium chloride 0.9 % 500 mL IVPB  1,000 mg Intravenous Once Perkins, Alexzandrew L, PA-C         Patient confirms/reports the following allergies:  Allergies  Allergen Reactions  . Hydrocodone Nausea And Vomiting    No orders of the defined types were placed in this encounter.   AUTHORIZATION INFORMATION Primary Insurance: [facility],  [place] #: [id number] Pre-Cert / Auth required:yes Pre-Cert / Auth #: [id number]   SCHEDULE INFORMATION: Procedure has been scheduled as follows:  Date: 12/29/18, Time: 10:45 Location: [facility] Dr.Rourk  This Gastroenterology Pre-Precedure Review Form is being routed to the following provider(s): Roseanne Kaufman NP

## 2018-10-30 NOTE — Patient Instructions (Addendum)
Jordan Lopez  11/27/1960 MRN: [id number]     Procedure Date: 02/28/2019 Time to register: 12:00pm Place to register: [facility] Procedure Time: 1:00pm Scheduled provider: R. Garfield Cornea, MD    PREPARATION FOR COLONOSCOPY WITH SUPREP BOWEL PREP KIT  Note: Suprep Bowel Prep Kit is a split-dose (2day) regimen. Consumption of BOTH 6-ounce bottles is required for a complete prep.  Please notify us immediately if you are diabetic, take iron supplements, or if you are on Coumadin or any other blood thinners.                                                                                                                                                  2 DAYS BEFORE PROCEDURE:  DATE: 02/26/2019    DAY: Monday Begin clear liquid diet AFTER your lunch meal. NO SOLID FOODS after this point.  1 DAY BEFORE PROCEDURE:  DATE: 02/27/2019   DAY: Tuesday Continue clear liquids the entire day - NO SOLID FOOD.    At 6:00pm: Complete steps 1 through 4 below, using ONE (1) 6-ounce bottle, before going to bed. Step 1:  Pour ONE (1) 6-ounce bottle of SUPREP liquid into the mixing container.  Step 2:  Add cool drinking water to the 16 ounce line on the container and mix.  Note: Dilute the solution concentrate as directed prior to use. Step 3:  DRINK ALL the liquid in the container. Step 4:  You MUST drink an additional two (2) or more 16 ounce containers of water over the next one (1) hour.   Continue clear liquids.  DAY OF PROCEDURE:   DATE: 02/28/2019  DAY: Wednesday If you take medications for your heart, blood pressure, or breathing, you may take these medications.   5 hours before your procedure at :8:00am Step 1:  Pour ONE (1) 6-ounce bottle of SUPREP liquid into the mixing container.  Step 2:  Add cool drinking water to the 16 ounce line on the container and mix.  Note: Dilute the solution concentrate as directed prior to use. Step 3:  DRINK ALL the liquid in the container. Step 4:   You MUST drink an additional two (2) or more 16 ounce containers of water over the next one (1) hour. You MUST complete the final glass of water at least 3 hours before your colonoscopy.    Nothing by mouth past 10:00am  You may take your morning medications with sip of water unless we have instructed otherwise.    Please see below for Dietary Information.  CLEAR LIQUIDS INCLUDE:  Water Jello (NOT red in color)   Ice Popsicles (NOT red in color)   Tea (sugar ok, no milk/cream) Powdered fruit flavored drinks  Coffee (sugar ok, no milk/cream) Gatorade/ Lemonade/ Kool-Aid  (NOT red in color)   Juice: apple, white grape, white cranberry Soft drinks  Clear bullion, consomme, broth (fat free beef/chicken/vegetable)  Carbonated beverages (any kind)  Strained chicken noodle soup Hard Candy   Remember: Clear liquids are liquids that will allow you to see your fingers on the other side of a clear glass. Be sure liquids are NOT red in color, and not cloudy, but CLEAR.  DO NOT EAT OR DRINK ANY OF THE FOLLOWING:  Dairy products of any kind   Cranberry juice Tomato juice / V8 juice   Grapefruit juice Orange juice     Red grape juice  Do not eat any solid foods, including such foods as: cereal, oatmeal, yogurt, fruits, vegetables, creamed soups, eggs, bread, crackers, pureed foods in a blender, etc.   HELPFUL HINTS FOR DRINKING PREP SOLUTION:   Make sure prep is extremely cold. Mix and refrigerate the the morning of the prep. You may also put in the freezer.   You may try mixing some Crystal Light or Country Time Lemonade if you prefer. Mix in small amounts; add more if necessary.  Try drinking through a straw  Rinse mouth with water or a mouthwash between glasses, to remove after-taste.  Try sipping on a cold beverage /ice/ popsicles between glasses of prep.  Place a piece of sugar-free hard candy in mouth between glasses.  If you become nauseated, try consuming smaller amounts, or  stretch out the time between glasses. Stop for 30-60 minutes, then slowly start back drinking.     OTHER INSTRUCTIONS  You will need a responsible adult at least 60 years of age to accompany you and drive you home. This person must remain in the waiting room during your procedure. The hospital will cancel your procedure if you do not have a responsible adult with you.   1. Wear loose fitting clothing that is easily removed. 2. Leave jewelry and other valuables at home.  3. Remove all body piercing jewelry and leave at home. 4. Total time from sign-in until discharge is approximately 2-3 hours. 5. You should go home directly after your procedure and rest. You can resume normal activities the day after your procedure. 6. The day of your procedure you should not:  Drive  Make legal decisions  Operate machinery  Drink alcohol  Return to work   You may call the office (Dept: [phone]) before 5:00pm, or page the doctor on call [phone]) after 5:00pm, for further instructions, if necessary.   Insurance Information YOU WILL NEED TO CHECK WITH YOUR INSURANCE COMPANY FOR THE BENEFITS OF COVERAGE YOU HAVE FOR THIS PROCEDURE.  UNFORTUNATELY, NOT ALL INSURANCE COMPANIES HAVE BENEFITS TO COVER ALL OR PART OF THESE TYPES OF PROCEDURES.  IT IS YOUR RESPONSIBILITY TO CHECK YOUR BENEFITS, HOWEVER, WE WILL BE GLAD TO ASSIST YOU WITH ANY CODES YOUR INSURANCE COMPANY MAY NEED.    PLEASE NOTE THAT MOST INSURANCE COMPANIES WILL NOT COVER A SCREENING COLONOSCOPY FOR PEOPLE UNDER THE AGE OF 50  IF YOU HAVE BCBS INSURANCE, YOU MAY HAVE BENEFITS FOR A SCREENING COLONOSCOPY BUT IF POLYPS ARE FOUND THE DIAGNOSIS WILL CHANGE AND THEN YOU MAY HAVE A DEDUCTIBLE THAT WILL NEED TO BE MET. SO PLEASE MAKE SURE YOU CHECK YOUR BENEFITS FOR A SCREENING COLONOSCOPY AS WELL AS A DIAGNOSTIC COLONOSCOPY.

## 2018-10-31 NOTE — Progress Notes (Signed)
Appropriate.

## 2018-11-01 ENCOUNTER — Other Ambulatory Visit (INDEPENDENT_AMBULATORY_CARE_PROVIDER_SITE_OTHER): Payer: Self-pay | Admitting: Otolaryngology

## 2018-11-03 NOTE — Progress Notes (Signed)
Kim called- there was someone already scheduled on 12/29/18 at 10:45. The rest of the schedule is full. I called and apologized to the pt and rescheduled him to 01/05/19 at 12:00. New instructions mailed to the pt. Pt is ok with this. Called and informed Kim.

## 2018-11-03 NOTE — Addendum Note (Signed)
Addended by: Metro Kung on: 11/03/2018 08:50 AM   Modules accepted: Orders, SmartSet

## 2018-11-13 ENCOUNTER — Other Ambulatory Visit (INDEPENDENT_AMBULATORY_CARE_PROVIDER_SITE_OTHER): Payer: Self-pay | Admitting: Otolaryngology

## 2018-12-22 NOTE — Progress Notes (Signed)
Pt's procedure was re-scheduled to 02/28/2019 due to COVID 19.  Pt aware that we are mailing out new instructions.  Endo notified.

## 2019-02-19 ENCOUNTER — Telehealth: Payer: Self-pay | Admitting: *Deleted

## 2019-02-19 NOTE — Telephone Encounter (Signed)
Pt is scheduled for his COVID 19 screening on 02/23/2019.  Pt is aware to remain in quarantine once testing is done.  Pt voiced understanding.

## 2019-02-23 ENCOUNTER — Other Ambulatory Visit: Payer: Self-pay

## 2019-02-23 ENCOUNTER — Other Ambulatory Visit (HOSPITAL_COMMUNITY)
Admission: RE | Admit: 2019-02-23 | Discharge: 2019-02-23 | Disposition: A | Discharge status: Home / Self Care | Payer: 59 | Source: Ambulatory Visit | Attending: Internal Medicine | Admitting: Internal Medicine

## 2019-02-23 DIAGNOSIS — Z1159 Encounter for screening for other viral diseases: Secondary | ICD-10-CM | POA: Insufficient documentation

## 2019-02-24 LAB — NOVEL CORONAVIRUS, NAA (HOSP ORDER, SEND-OUT TO REF LAB; TAT 18-24 HRS): SARS-CoV-2, NAA: NOT DETECTED

## 2019-02-28 ENCOUNTER — Ambulatory Visit (HOSPITAL_COMMUNITY)
Admission: RE | Admit: 2019-02-28 | Discharge: 2019-02-28 | Disposition: A | Discharge status: Home / Self Care | Payer: 59 | Attending: Internal Medicine | Admitting: Internal Medicine

## 2019-02-28 ENCOUNTER — Encounter (HOSPITAL_COMMUNITY)
Admission: RE | Disposition: A | Discharge status: Home / Self Care | Payer: Self-pay | Source: Home / Self Care | Attending: Internal Medicine

## 2019-02-28 ENCOUNTER — Encounter (HOSPITAL_COMMUNITY): Payer: Self-pay | Admitting: *Deleted

## 2019-02-28 ENCOUNTER — Other Ambulatory Visit: Payer: Self-pay

## 2019-02-28 DIAGNOSIS — Z96651 Presence of right artificial knee joint: Secondary | ICD-10-CM | POA: Insufficient documentation

## 2019-02-28 DIAGNOSIS — Z79899 Other long term (current) drug therapy: Secondary | ICD-10-CM | POA: Diagnosis not present

## 2019-02-28 DIAGNOSIS — K621 Rectal polyp: Secondary | ICD-10-CM | POA: Diagnosis not present

## 2019-02-28 DIAGNOSIS — D128 Benign neoplasm of rectum: Secondary | ICD-10-CM | POA: Insufficient documentation

## 2019-02-28 DIAGNOSIS — Z1211 Encounter for screening for malignant neoplasm of colon: Secondary | ICD-10-CM

## 2019-02-28 HISTORY — PX: POLYPECTOMY: SHX5525

## 2019-02-28 HISTORY — PX: COLONOSCOPY: SHX5424

## 2019-02-28 SURGERY — COLONOSCOPY
Anesthesia: Moderate Sedation

## 2019-02-28 MED ORDER — ONDANSETRON HCL 4 MG/2ML IJ SOLN
INTRAMUSCULAR | Status: AC
  Filled 2019-02-28: qty 2

## 2019-02-28 MED ORDER — SODIUM CHLORIDE 0.9 % IV SOLN
INTRAVENOUS | Status: DC
  Administered 2019-02-28: 12:00:00 via INTRAVENOUS

## 2019-02-28 MED ORDER — MIDAZOLAM HCL 5 MG/5ML IJ SOLN
INTRAMUSCULAR | Status: DC | PRN
  Administered 2019-02-28 (×2): 2 mg via INTRAVENOUS
  Administered 2019-02-28: 1 mg via INTRAVENOUS

## 2019-02-28 MED ORDER — MIDAZOLAM HCL 5 MG/5ML IJ SOLN
INTRAMUSCULAR | Status: AC
  Filled 2019-02-28: qty 10

## 2019-02-28 MED ORDER — MEPERIDINE HCL 50 MG/ML IJ SOLN
INTRAMUSCULAR | Status: AC
  Filled 2019-02-28: qty 1

## 2019-02-28 MED ORDER — ONDANSETRON HCL 4 MG/2ML IJ SOLN
INTRAMUSCULAR | Status: DC | PRN
  Administered 2019-02-28: 4 mg via INTRAVENOUS

## 2019-02-28 MED ORDER — MEPERIDINE HCL 100 MG/ML IJ SOLN
INTRAMUSCULAR | Status: DC | PRN
  Administered 2019-02-28: 25 mg
  Administered 2019-02-28: 15 mg via INTRAVENOUS

## 2019-02-28 NOTE — Op Note (Signed)
[facility] Patient Name: Jordan Lopez Procedure Date: 02/28/2019 11:35 AM MRN: [id number] Date of Birth: [birth year] Attending MD: Norvel Richards , MD CSN: [id number] Age: 58 Admit Type: Outpatient Procedure:                Colonoscopy Indications:              Screening for colorectal malignant neoplasm Providers:                Norvel Richards, MD, Janeece Riggers, RN, Raphael Gibney, Technician Referring MD:              Medicines:                Midazolam 4 mg IV, Meperidine 40 mg IV, Ondansetron                            4 mg IV Complications:            No immediate complications. Estimated Blood Loss:     Estimated blood loss was minimal. Procedure:                After obtaining informed consent, the colonoscope                            was passed under direct vision. Throughout the                            procedure, the patient's blood pressure, pulse, and                            oxygen saturations were monitored continuously. The                            CF-HQ190L ([id number]) scope was introduced through                            the anus and advanced to the the cecum, identified                            by appendiceal orifice and ileocecal valve. The                            colonoscopy was performed without difficulty. The                            patient tolerated the procedure well. The quality                            of the bowel preparation was adequate. Scope In: 12:28:07 PM Scope Out: 12:41:11 PM Scope Withdrawal Time: 0 hours 11 minutes 30 seconds  Total Procedure Duration: 0 hours 13 minutes 4 seconds  Findings:      The perianal and digital rectal examinations were normal.      Two sessile polyps were found in the rectum. The  polyps were 2 to 3 mm       in size. These polyps were removed with a cold snare. Resection and       retrieval were complete. Estimated blood loss was minimal.      A 10 mm polyp  was found in the rectum. The polyp was semi-pedunculated.       The polyp was removed with a hot snare. Resection and retrieval were       complete. Estimated blood loss: none.      The exam was otherwise without abnormality on direct and retroflexion       views. 2 small polyps or end polyps were ablated with tip with a hot       snare loop. Impression:               - Two 2 to 3 mm polyps in the rectum, removed with                            a cold snare. Resected and retrieved.                           - One 10 mm polyp in the rectum, removed with a hot                            snare. Resected and retrieved. 2 adjacent                            diminutive polyps were ablated with the tip of the                            hot snare loop. Diminutive polyps ablated.                           - The examination was otherwise normal on direct                            and retroflexion views. Moderate Sedation:      Moderate (conscious) sedation was administered by the endoscopy nurse       and supervised by the endoscopist. The following parameters were       monitored: oxygen saturation, heart rate, blood pressure, respiratory       rate, EKG, adequacy of pulmonary ventilation, and response to care.       Total physician intraservice time was 19 minutes. Recommendation:           - Patient has a contact number available for                            emergencies. The signs and symptoms of potential                            delayed complications were discussed with the                            patient. Return to normal activities tomorrow.  Written discharge instructions were provided to the                            patient.                           - Resume previous diet.                           - Continue present medications.                           - Repeat colonoscopy date to be determined after                            pending pathology  results are reviewed for                            surveillance.                           - Return to GI office (date not yet determined). Procedure Code(s):        --- Professional ---                           [phone], Moderate sedation services provided by the                            same physician or other qualified health care                            professional performing the diagnostic or                            therapeutic service that the sedation supports,                            requiring the presence of an independent trained                            observer to assist in the monitoring of the                            patient's level of consciousness and physiological                            status; initial 15 minutes of intraservice time,                            patient age 55 years or older Diagnosis Code(s):        --- Professional ---                           K62.1, Rectal polyp  Z12.11, Encounter for screening for malignant                            neoplasm of colon CPT copyright 2019 [employer]. All rights reserved. The codes documented in this report are preliminary and upon coder review may  be revised to meet current compliance requirements. Cristopher Estimable. Ellianah Cordy, MD Norvel Richards, MD 02/28/2019 12:52:48 PM This report has been signed electronically. Number of Addenda: 0

## 2019-02-28 NOTE — H&P (Signed)
_0 @   Primary Care Physician:  Manon Hilding, MD Primary Gastroenterologist:  Dr. Gala Romney  Pre-Procedure History & Physical: HPI:  Jordan Lopez is a 58 y.o. male is here for a screening colonoscopy.   Past Medical History:  Diagnosis Date  . Arthritis   . Complication of anesthesia   . PONV (postoperative nausea and vomiting)     Past Surgical History:  Procedure Laterality Date  . ETHMOIDECTOMY Bilateral 05/23/2018   Procedure: BILATERAL TOTAL ETHMOIDECTOMY AND SPHENOIDECTOMY WITH TISSUE REMOVAL;  Surgeon: Leta Baptist, MD;  Location: [place];  Service: ENT;  Laterality: Bilateral;  . FRONTAL SINUS EXPLORATION Bilateral 05/23/2018   Procedure: ENDOSCOPIC BILATERAL FRONTAL SINUS EXPLORATION;  Surgeon: Leta Baptist, MD;  Location: [place];  Service: ENT;  Laterality: Bilateral;  . MAXILLARY ANTROSTOMY Bilateral 05/23/2018   Procedure: ENDOSCOPIC BILATERAL MAXILLARY ANTROSTOMY WITH TISSUE REMOVAL;  Surgeon: Leta Baptist, MD;  Location: [place];  Service: ENT;  Laterality: Bilateral;  . NASAL SEPTOPLASTY W/ TURBINOPLASTY Bilateral 05/23/2018   Procedure: ENDOSCOPIC BILATERAL NASAL SEPTOPLASTY WITH TURBINATE REDUCTION;  Surgeon: Leta Baptist, MD;  Location: [place];  Service: ENT;  Laterality: Bilateral;  . right toe surgery   1977  . SINUS ENDO WITH FUSION Bilateral 05/23/2018   Procedure: SINUS ENDOSCOPY WITH FUSION NAVIGATION;  Surgeon: Leta Baptist, MD;  Location: [place];  Service: ENT;  Laterality: Bilateral;  . TOTAL KNEE ARTHROPLASTY Right 07/04/2017   Procedure: RIGHT TOTAL KNEE ARTHROPLASTY;  Surgeon: Gaynelle Arabian, MD;  Location: [facility];  Service: Orthopedics;  Laterality: Right;    Prior to Admission medications   Medication Sig Start Date End Date Taking? Authorizing Provider  CALCIUM-MAGNESIUM-ZINC PO Take 2 tablets by mouth daily.   Yes [provider]  finasteride (PROSCAR) 5 MG tablet Take 5  mg by mouth daily.   Yes [provider]  fluticasone (FLONASE) 50 MCG/ACT nasal spray Place 2 sprays into both nostrils 2 (two) times a day.  09/28/18  Yes [provider]  gabapentin (NEURONTIN) 300 MG capsule Take 300 mg by mouth at bedtime.  10/02/18  Yes [provider]  GLUCOSAMINE-CHONDROITIN PO Take 1 tablet by mouth daily.   Yes [provider]  Multiple Vitamin (MULTIVITAMIN) capsule Take 2 capsules by mouth daily.    Yes [provider]  Na Sulfate-K Sulfate-Mg Sulf (SUPREP BOWEL PREP KIT) 17.5-3.13-1.6 GM/177ML SOLN Take 1 kit by mouth as directed. 10/30/18  Yes Annitta Needs, NP  Omega 3-6-9 Fatty Acids (OMEGA 3-6-9 COMPLEX PO) Take 2 capsules by mouth daily.   Yes [provider]  OVER THE COUNTER MEDICATION Take 1 capsule by mouth 2 (two) times a day. Testosterone Booster   Yes [provider]  ST JOHNS WORT PO Take 1 capsule by mouth daily.   Yes [provider]    Allergies as of 11/03/2018 - Review Complete 10/30/2018  Allergen Reaction Noted  . Hydrocodone Nausea And Vomiting 06/27/2017    Family History  Problem Relation Age of Onset  . Colon cancer Paternal Aunt     Social History   Socioeconomic History  . Marital status: Married    Spouse name: Not on file  . Number of children: Not on file  . Years of education: Not on file  . Highest education level: Not on file  Occupational History  . Not on file  Social Needs  . Financial resource strain: Not on file  . Food  insecurity    Worry: Not on file    Inability: Not on file  . Transportation needs    Medical: Not on file    Non-medical: Not on file  Tobacco Use  . Smoking status: Never Smoker  . Smokeless tobacco: Never Used  Substance and Sexual Activity  . Alcohol use: Yes    Comment: beer on occasion  . Drug use: No  . Sexual activity: Not on file  Lifestyle  . Physical activity    Days per week: Not on file    Minutes per  session: Not on file  . Stress: Not on file  Relationships  . Social Herbalist on phone: Not on file    Gets together: Not on file    Attends religious service: Not on file    Active member of club or organization: Not on file    Attends meetings of clubs or organizations: Not on file    Relationship status: Not on file  . Intimate partner violence    Fear of current or ex partner: Not on file    Emotionally abused: Not on file    Physically abused: Not on file    Forced sexual activity: Not on file  Other Topics Concern  . Not on file  Social History Narrative  . Not on file    Review of Systems: See HPI, otherwise negative ROS  Physical Exam: There were no vitals taken for this visit. General:   Alert,  Well-developed, well-nourished, pleasant and cooperative in NAD Neck:  Supple; no masses or thyromegaly. Lungs:  Clear throughout to auscultation.   No wheezes, crackles, or rhonchi. No acute distress. Heart:  Regular rate and rhythm; no murmurs, clicks, rubs,  or gallops. Abdomen:  Soft, nontender and nondistended. No masses, hepatosplenomegaly or hernias noted. Normal bowel sounds, without guarding, and without rebound.     Impression/Plan: Jordan Lopez is now here to undergo a screening colonoscopy.  First ever average risk screening examination.  Risks, benefits, limitations, imponderables and alternatives regarding colonoscopy have been reviewed with the patient. Questions have been answered. All parties agreeable.     Notice:  This dictation was prepared with Dragon dictation along with smaller phrase technology. Any transcriptional errors that result from this process are unintentional and may not be corrected upon review.

## 2019-02-28 NOTE — Discharge Instructions (Signed)
Colonoscopy Discharge Instructions  Read the instructions outlined below and refer to this sheet in the next few weeks. These discharge instructions provide you with general information on caring for yourself after you leave the hospital. Your doctor may also give you specific instructions. While your treatment has been planned according to the most current medical practices available, unavoidable complications occasionally occur. If you have any problems or questions after discharge, call Dr. Gala Romney at [phone]. ACTIVITY  You may resume your regular activity, but move at a slower pace for the next 24 hours.   Take frequent rest periods for the next 24 hours.   Walking will help get rid of the air and reduce the bloated feeling in your belly (abdomen).   No driving for 24 hours (because of the medicine (anesthesia) used during the test).    Do not sign any important legal documents or operate any machinery for 24 hours (because of the anesthesia used during the test).  NUTRITION  Drink plenty of fluids.   You may resume your normal diet as instructed by your doctor.   Begin with a light meal and progress to your normal diet. Heavy or fried foods are harder to digest and may make you feel sick to your stomach (nauseated).   Avoid alcoholic beverages for 24 hours or as instructed.  MEDICATIONS  You may resume your normal medications unless your doctor tells you otherwise.  WHAT YOU CAN EXPECT TODAY  Some feelings of bloating in the abdomen.   Passage of more gas than usual.   Spotting of blood in your stool or on the toilet paper.  IF YOU HAD POLYPS REMOVED DURING THE COLONOSCOPY:  No aspirin products for 7 days or as instructed.   No alcohol for 7 days or as instructed.   Eat a soft diet for the next 24 hours.  FINDING OUT THE RESULTS OF YOUR TEST Not all test results are available during your visit. If your test results are not back during the visit, make an appointment  with your caregiver to find out the results. Do not assume everything is normal if you have not heard from your caregiver or the medical facility. It is important for you to follow up on all of your test results.  SEEK IMMEDIATE MEDICAL ATTENTION IF:  You have more than a spotting of blood in your stool.   Your belly is swollen (abdominal distention).   You are nauseated or vomiting.   You have a temperature over 101.   You have abdominal pain or discomfort that is severe or gets worse throughout the day.   Colon polyp information provided  Further recommendations to follow pending review of pathology report  At patient's request, I spoke to [place], at [phone], and discussed results   Colon Polyps  Polyps are tissue growths inside the body. Polyps can grow in many places, including the large intestine (colon). A polyp may be a round bump or a mushroom-shaped growth. You could have one polyp or several. Most colon polyps are noncancerous (benign). However, some colon polyps can become cancerous over time. Finding and removing the polyps early can help prevent this. What are the causes? The exact cause of colon polyps is not known. What increases the risk? You are more likely to develop this condition if you:  Have a family history of colon cancer or colon polyps.  Are older than 33 or older than 45 if you are African American.  Have inflammatory bowel disease, such  as ulcerative colitis or Crohn's disease.  Have certain hereditary conditions, such as: ? Familial adenomatous polyposis. ? Lynch syndrome. ? Turcot syndrome. ? Peutz-Jeghers syndrome.  Are overweight.  Smoke cigarettes.  Do not get enough exercise.  Drink too much alcohol.  Eat a diet that is high in fat and red meat and low in fiber.  Had childhood cancer that was treated with abdominal radiation. What are the signs or symptoms? Most polyps do not cause symptoms. If you have symptoms, they may  include:  Blood coming from your rectum when having a bowel movement.  Blood in your stool. The stool may look dark red or black.  Abdominal pain.  A change in bowel habits, such as constipation or diarrhea. How is this diagnosed? This condition is diagnosed with a colonoscopy. This is a procedure in which a lighted, flexible scope is inserted into the anus and then passed into the colon to examine the area. Polyps are sometimes found when a colonoscopy is done as part of routine cancer screening tests. How is this treated? Treatment for this condition involves removing any polyps that are found. Most polyps can be removed during a colonoscopy. Those polyps will then be tested for cancer. Additional treatment may be needed depending on the results of testing. Follow these instructions at home: Lifestyle  Maintain a healthy weight, or lose weight if recommended by your health care provider.  Exercise every day or as told by your health care provider.  Do not use any products that contain nicotine or tobacco, such as cigarettes and e-cigarettes. If you need help quitting, ask your health care provider.  If you drink alcohol, limit how much you have: ? 0-1 drink a day for women. ? 0-2 drinks a day for men.  Be aware of how much alcohol is in your drink. In the [place] one drink equals one 12 oz bottle of beer (355 mL), one 5 oz glass of wine (148 mL), or one 1 oz shot of hard liquor (44 mL). Eating and drinking   Eat foods that are high in fiber, such as fruits, vegetables, and whole grains.  Eat foods that are high in calcium and vitamin D, such as milk, cheese, yogurt, eggs, liver, fish, and broccoli.  Limit foods that are high in fat, such as fried foods and desserts.  Limit the amount of red meat and processed meat you eat, such as hot dogs, sausage, bacon, and lunch meats. General instructions  Keep all follow-up visits as told by your health care provider. This is  important. ? This includes having regularly scheduled colonoscopies. ? Talk to your health care provider about when you need a colonoscopy. Contact a health care provider if:  You have new or worsening bleeding during a bowel movement.  You have new or increased blood in your stool.  You have a change in bowel habits.  You lose weight for no known reason. Summary  Polyps are tissue growths inside the body. Polyps can grow in many places, including the colon.  Most colon polyps are noncancerous (benign), but some can become cancerous over time.  This condition is diagnosed with a colonoscopy.  Treatment for this condition involves removing any polyps that are found. Most polyps can be removed during a colonoscopy. This information is not intended to replace advice given to you by your health care provider. Make sure you discuss any questions you have with your health care provider. Document Released: 05/12/2004 Document Revised: 12/01/2017 Document Reviewed:  12/01/2017 [employer] Patient Education  Cent[employer].

## 2019-03-01 ENCOUNTER — Encounter: Payer: Self-pay | Admitting: Internal Medicine

## 2019-03-07 ENCOUNTER — Encounter (HOSPITAL_COMMUNITY): Payer: Self-pay | Admitting: Internal Medicine

## 2019-03-26 ENCOUNTER — Other Ambulatory Visit: Payer: Self-pay

## 2019-03-26 ENCOUNTER — Ambulatory Visit (INDEPENDENT_AMBULATORY_CARE_PROVIDER_SITE_OTHER): Payer: 59 | Admitting: Otolaryngology

## 2019-03-26 DIAGNOSIS — J321 Chronic frontal sinusitis: Secondary | ICD-10-CM | POA: Diagnosis not present

## 2019-03-26 DIAGNOSIS — J320 Chronic maxillary sinusitis: Secondary | ICD-10-CM

## 2019-03-26 DIAGNOSIS — J322 Chronic ethmoidal sinusitis: Secondary | ICD-10-CM

## 2019-04-19 ENCOUNTER — Ambulatory Visit (INDEPENDENT_AMBULATORY_CARE_PROVIDER_SITE_OTHER): Payer: 59 | Admitting: Otolaryngology

## 2019-04-19 ENCOUNTER — Other Ambulatory Visit: Payer: Self-pay

## 2019-04-19 DIAGNOSIS — J320 Chronic maxillary sinusitis: Secondary | ICD-10-CM

## 2019-04-19 DIAGNOSIS — J322 Chronic ethmoidal sinusitis: Secondary | ICD-10-CM

## 2019-04-19 DIAGNOSIS — J310 Chronic rhinitis: Secondary | ICD-10-CM

## 2020-01-04 IMAGING — CT CT MAXILLOFACIAL W/O CM
3 series · 13 of 47 positions shown, 15 images · non-contrast
Comparison: CT sinus 03/15/2018

CLINICAL DATA: Chronic maxillary sinusitis.

EXAM:
CT MAXILLOFACIAL WITHOUT CONTRAST
TECHNIQUE: Multidetector CT images of the paranasal sinuses were obtained using
the standard protocol without intravenous contrast.

[Series 2: standard · axial · 0.41mm/px · z∈[-18,+72]mm · 7 of 110 slices shown, 9 images]
[im 12/110  brain]
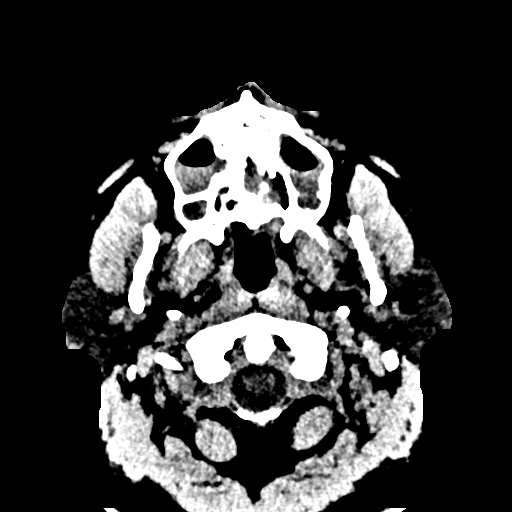
[im 12/110  bone]
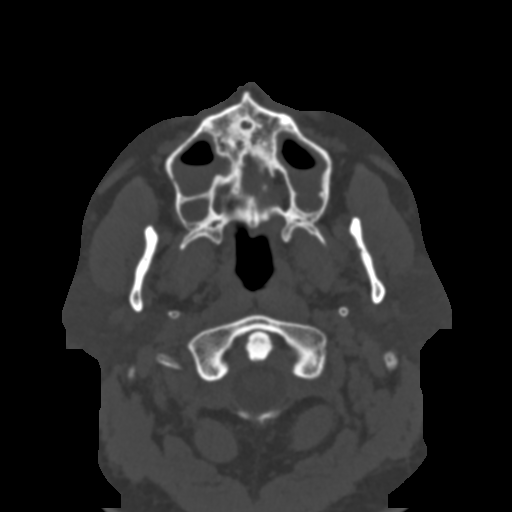
[im 27/110  bone]
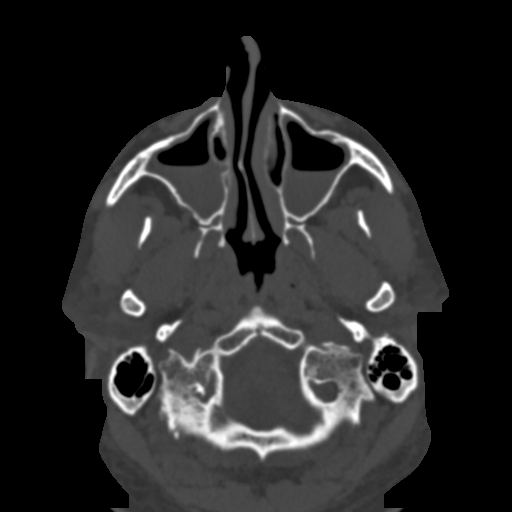
[im 42/110  bone]
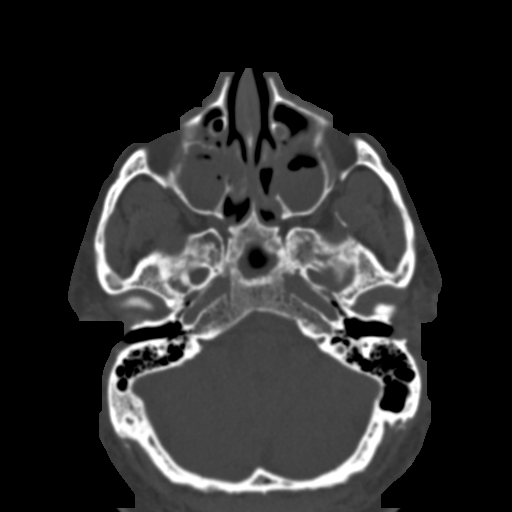
[im 57/110  bone]
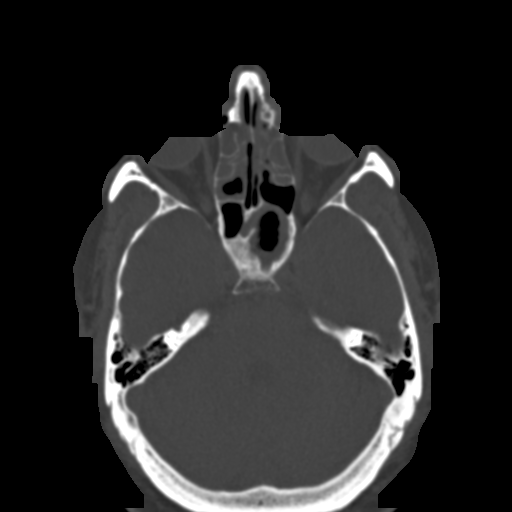
[im 72/110  brain]
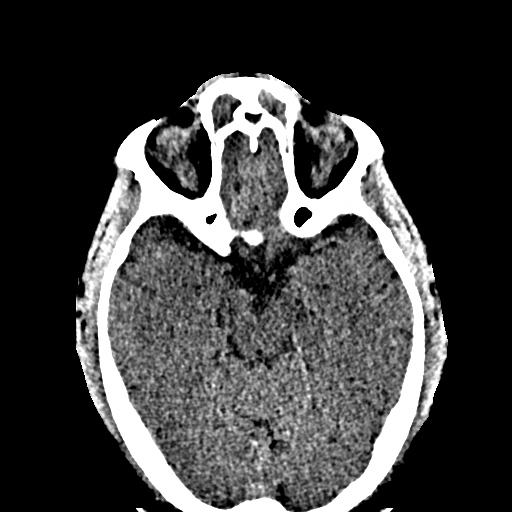
[im 72/110  bone]
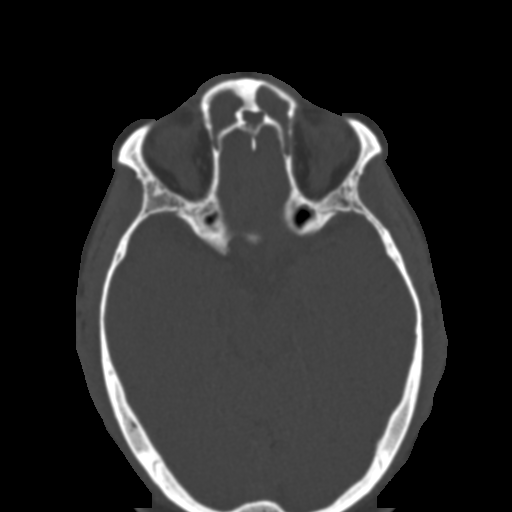
[im 87/110  bone]
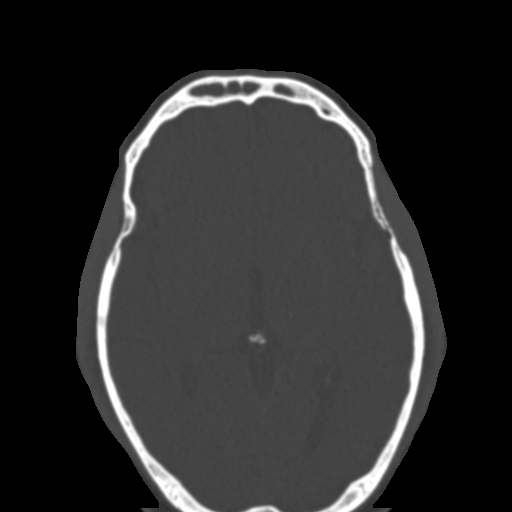
[im 102/110  bone]
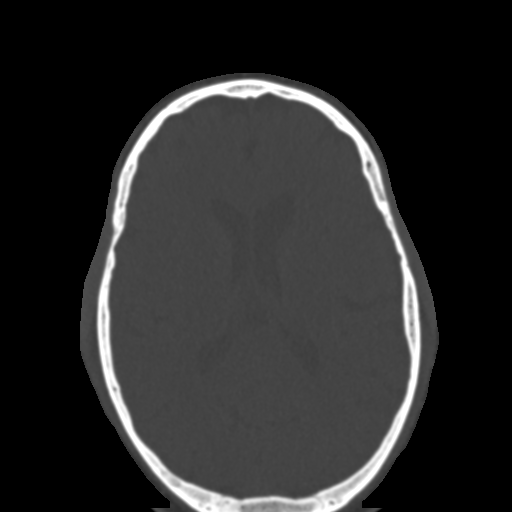

[Series 4: coronal · coronal · 0.23mm/px · 3 of 101 slices shown]
[im 34/101  bone]
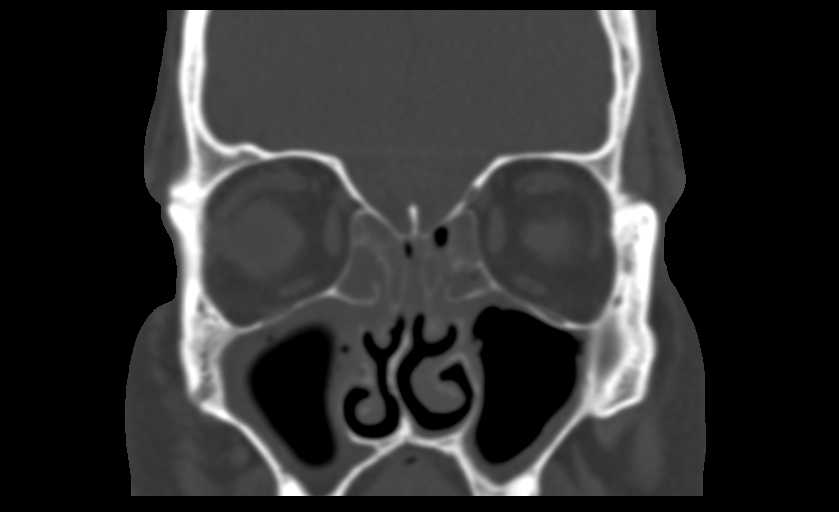
[im 45/101  bone]
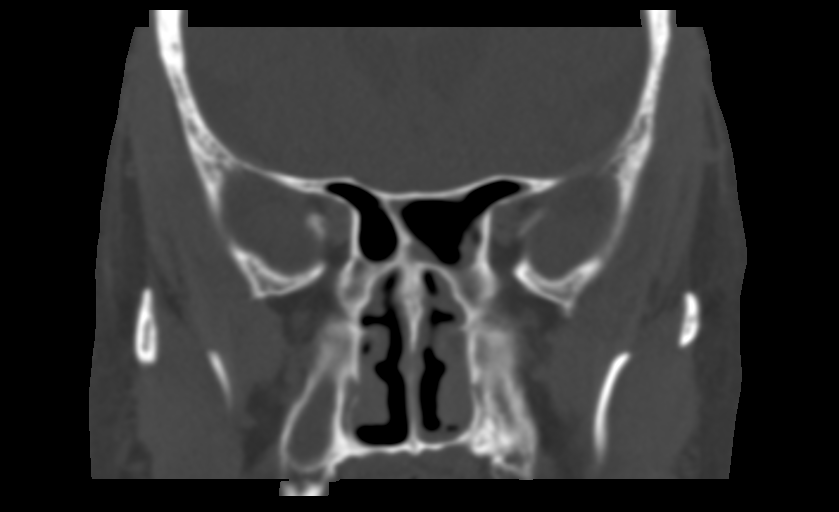
[im 56/101  bone]
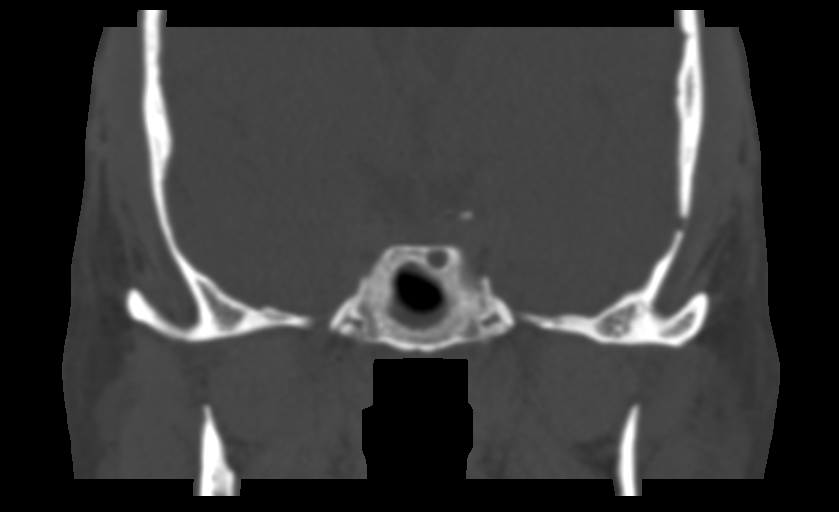

[Series 5: sagittal · sagittal · 0.22mm/px · 3 of 92 slices shown]
[im 31/92  bone]
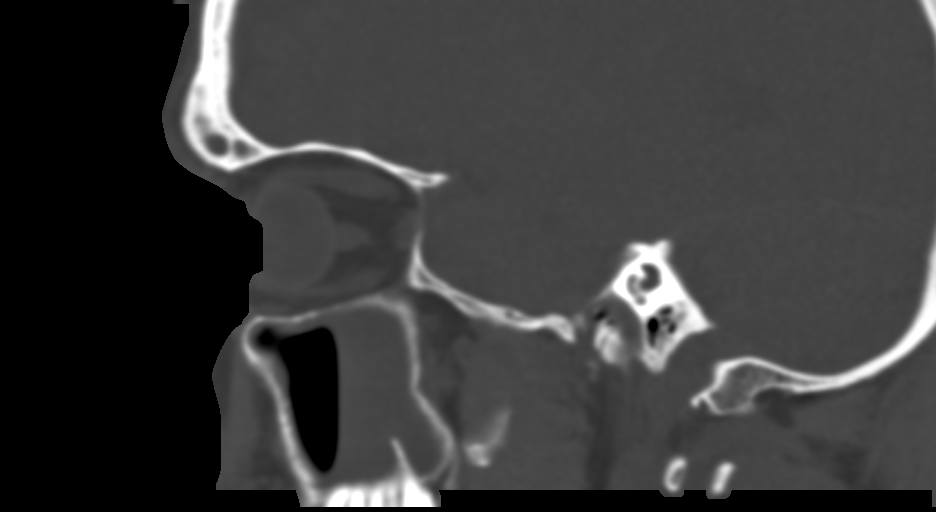
[im 46/92  bone]
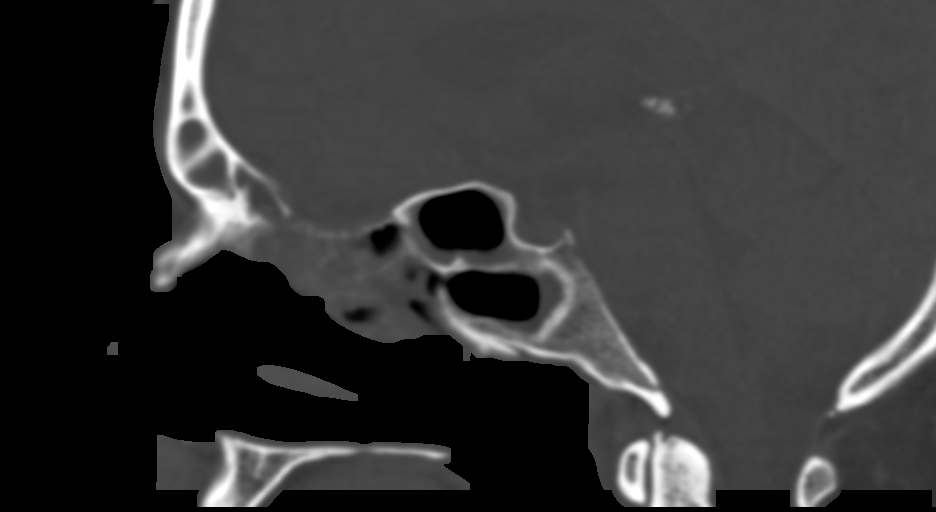
[im 61/92  bone]
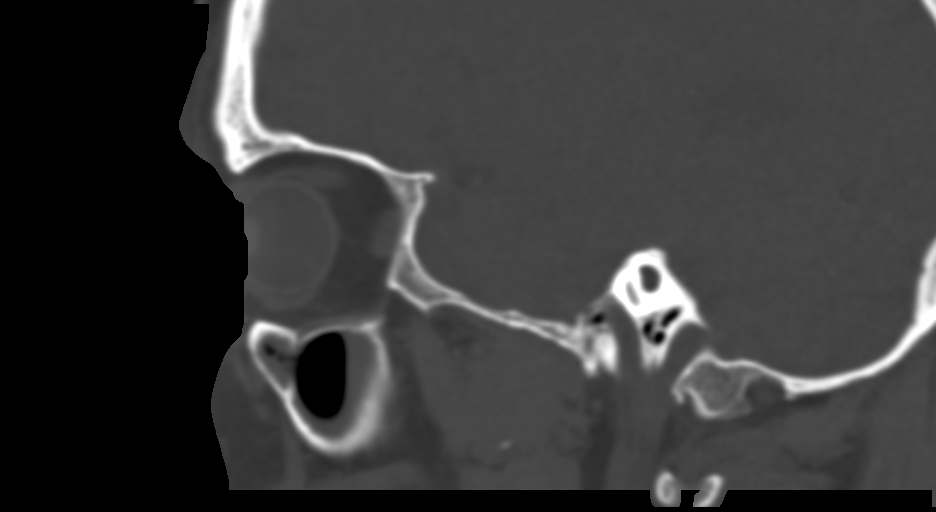

[13 of 47 positions shown; findings below may reference images not displayed]

FINDINGS: Paranasal sinuses:

Frontal: Complete opacification of the right frontal sinus with
surrounding bony thickening unchanged. Progressive opacification
left frontal sinus which is now completely opacified also with bony
thickening.

Ethmoid: Extensive mucosal edema in the ethmoid sinuses bilaterally
with progression.

Maxillary: Mucosal edema throughout the maxillary sinus bilaterally
with moderately large air-fluid levels bilaterally similar to the
prior study.

Sphenoid: Mucosal edema and air-fluid level in the sphenoid sinus
with surrounding bony thickening. Mild improved aeration in the
sphenoid sinus bilaterally compared with the prior study.

Mastoid: Clear bilaterally

Right ostiomeatal unit: Occluded

Left ostiomeatal unit: Occluded

Nasal passages: Patent. Moderate septal deviation to the right

Anatomy: Limited intracranial imaging negative. Negative soft
tissues of the orbit and face.
IMPRESSION: Progressive mucosal edema left frontal sinus. Both frontal sinuses
not completely opacified. Progressive mucosal edema ethmoid sinuses.

Mucosal edema and air-fluid levels in both maxillary sinuses
unchanged. Mild improvement in mucosal edema and air-fluid level in
the sphenoid sinus.

## 2020-04-10 ENCOUNTER — Other Ambulatory Visit (HOSPITAL_COMMUNITY): Payer: Self-pay | Admitting: Otolaryngology

## 2020-04-10 DIAGNOSIS — J329 Chronic sinusitis, unspecified: Secondary | ICD-10-CM

## 2020-04-17 ENCOUNTER — Other Ambulatory Visit: Payer: Self-pay

## 2020-04-17 ENCOUNTER — Ambulatory Visit (HOSPITAL_COMMUNITY)
Admission: RE | Admit: 2020-04-17 | Discharge: 2020-04-17 | Disposition: A | Discharge status: Home / Self Care | Payer: 59 | Source: Ambulatory Visit | Attending: Otolaryngology | Admitting: Otolaryngology

## 2020-04-17 DIAGNOSIS — J329 Chronic sinusitis, unspecified: Secondary | ICD-10-CM | POA: Diagnosis not present

## 2020-08-18 ENCOUNTER — Other Ambulatory Visit (HOSPITAL_COMMUNITY): Payer: Self-pay | Admitting: Otolaryngology

## 2020-08-18 ENCOUNTER — Other Ambulatory Visit: Payer: Self-pay | Admitting: Otolaryngology

## 2020-08-18 DIAGNOSIS — J323 Chronic sphenoidal sinusitis: Secondary | ICD-10-CM

## 2020-08-28 ENCOUNTER — Ambulatory Visit (HOSPITAL_COMMUNITY)
Admission: RE | Admit: 2020-08-28 | Discharge: 2020-08-28 | Disposition: A | Discharge status: Home / Self Care | Payer: 59 | Source: Ambulatory Visit | Attending: Otolaryngology | Admitting: Otolaryngology

## 2020-08-28 ENCOUNTER — Other Ambulatory Visit: Payer: Self-pay

## 2020-08-28 DIAGNOSIS — J323 Chronic sphenoidal sinusitis: Secondary | ICD-10-CM | POA: Insufficient documentation

## 2020-10-09 ENCOUNTER — Other Ambulatory Visit (INDEPENDENT_AMBULATORY_CARE_PROVIDER_SITE_OTHER): Payer: Self-pay | Admitting: Otolaryngology

## 2022-02-19 ENCOUNTER — Encounter: Payer: Self-pay | Admitting: *Deleted
